# Patient Record
Sex: Male | Born: 1945 | Race: White | Hispanic: No | Marital: Married | Smoking: Never smoker
Health system: Southern US, Community
[De-identification: ages and names within clinical notes are randomized; demographics above are authoritative.]

## PROBLEM LIST (undated history)

## (undated) DIAGNOSIS — A159 Respiratory tuberculosis unspecified: Secondary | ICD-10-CM

## (undated) DIAGNOSIS — Z87442 Personal history of urinary calculi: Secondary | ICD-10-CM

## (undated) DIAGNOSIS — K760 Fatty (change of) liver, not elsewhere classified: Secondary | ICD-10-CM

## (undated) DIAGNOSIS — G4730 Sleep apnea, unspecified: Secondary | ICD-10-CM

## (undated) DIAGNOSIS — C801 Malignant (primary) neoplasm, unspecified: Secondary | ICD-10-CM

## (undated) DIAGNOSIS — E249 Cushing's syndrome, unspecified: Secondary | ICD-10-CM

## (undated) DIAGNOSIS — E119 Type 2 diabetes mellitus without complications: Secondary | ICD-10-CM

## (undated) DIAGNOSIS — R011 Cardiac murmur, unspecified: Secondary | ICD-10-CM

## (undated) DIAGNOSIS — K219 Gastro-esophageal reflux disease without esophagitis: Secondary | ICD-10-CM

## (undated) DIAGNOSIS — M1990 Unspecified osteoarthritis, unspecified site: Secondary | ICD-10-CM

## (undated) DIAGNOSIS — I10 Essential (primary) hypertension: Secondary | ICD-10-CM

## (undated) HISTORY — PX: MOHS SURGERY: SUR867

## (undated) HISTORY — PX: HERNIA REPAIR: SHX51

## (undated) HISTORY — PX: EYE SURGERY: SHX253

## (undated) HISTORY — PX: CHOLECYSTECTOMY: SHX55

## (undated) HISTORY — PX: TONSILLECTOMY: SUR1361

## (undated) HISTORY — PX: FRACTURE SURGERY: SHX138

---

## 2011-05-10 DIAGNOSIS — N529 Male erectile dysfunction, unspecified: Secondary | ICD-10-CM | POA: Insufficient documentation

## 2012-05-31 HISTORY — PX: ADRENALECTOMY: SHX876

## 2013-03-28 DIAGNOSIS — J40 Bronchitis, not specified as acute or chronic: Secondary | ICD-10-CM | POA: Diagnosis not present

## 2013-03-28 DIAGNOSIS — R0602 Shortness of breath: Secondary | ICD-10-CM | POA: Diagnosis not present

## 2013-03-28 DIAGNOSIS — H109 Unspecified conjunctivitis: Secondary | ICD-10-CM | POA: Diagnosis not present

## 2013-04-04 DIAGNOSIS — G4733 Obstructive sleep apnea (adult) (pediatric): Secondary | ICD-10-CM | POA: Diagnosis not present

## 2013-04-04 DIAGNOSIS — J40 Bronchitis, not specified as acute or chronic: Secondary | ICD-10-CM | POA: Diagnosis not present

## 2013-04-04 DIAGNOSIS — Z23 Encounter for immunization: Secondary | ICD-10-CM | POA: Diagnosis not present

## 2013-04-05 DIAGNOSIS — D3500 Benign neoplasm of unspecified adrenal gland: Secondary | ICD-10-CM | POA: Diagnosis not present

## 2013-04-05 DIAGNOSIS — Q441 Other congenital malformations of gallbladder: Secondary | ICD-10-CM | POA: Diagnosis not present

## 2013-04-05 DIAGNOSIS — E279 Disorder of adrenal gland, unspecified: Secondary | ICD-10-CM | POA: Diagnosis not present

## 2013-04-05 DIAGNOSIS — K869 Disease of pancreas, unspecified: Secondary | ICD-10-CM | POA: Diagnosis not present

## 2013-04-05 DIAGNOSIS — N200 Calculus of kidney: Secondary | ICD-10-CM | POA: Diagnosis not present

## 2013-04-20 DIAGNOSIS — I10 Essential (primary) hypertension: Secondary | ICD-10-CM | POA: Diagnosis not present

## 2013-04-20 DIAGNOSIS — K869 Disease of pancreas, unspecified: Secondary | ICD-10-CM | POA: Diagnosis not present

## 2013-04-20 DIAGNOSIS — D499 Neoplasm of unspecified behavior of unspecified site: Secondary | ICD-10-CM | POA: Diagnosis not present

## 2013-04-20 DIAGNOSIS — D379 Neoplasm of uncertain behavior of digestive organ, unspecified: Secondary | ICD-10-CM | POA: Diagnosis not present

## 2013-05-09 DIAGNOSIS — I10 Essential (primary) hypertension: Secondary | ICD-10-CM | POA: Diagnosis not present

## 2013-05-09 DIAGNOSIS — E279 Disorder of adrenal gland, unspecified: Secondary | ICD-10-CM | POA: Diagnosis not present

## 2013-05-16 DIAGNOSIS — E279 Disorder of adrenal gland, unspecified: Secondary | ICD-10-CM | POA: Diagnosis not present

## 2013-05-16 DIAGNOSIS — E249 Cushing's syndrome, unspecified: Secondary | ICD-10-CM | POA: Diagnosis not present

## 2013-05-16 DIAGNOSIS — K862 Cyst of pancreas: Secondary | ICD-10-CM | POA: Diagnosis not present

## 2013-05-19 DIAGNOSIS — E249 Cushing's syndrome, unspecified: Secondary | ICD-10-CM | POA: Diagnosis not present

## 2013-05-19 DIAGNOSIS — K862 Cyst of pancreas: Secondary | ICD-10-CM | POA: Diagnosis not present

## 2013-05-20 DIAGNOSIS — E249 Cushing's syndrome, unspecified: Secondary | ICD-10-CM | POA: Diagnosis not present

## 2013-05-20 DIAGNOSIS — K862 Cyst of pancreas: Secondary | ICD-10-CM | POA: Diagnosis not present

## 2013-05-21 DIAGNOSIS — E249 Cushing's syndrome, unspecified: Secondary | ICD-10-CM | POA: Diagnosis not present

## 2013-05-21 DIAGNOSIS — K862 Cyst of pancreas: Secondary | ICD-10-CM | POA: Diagnosis not present

## 2013-05-28 DIAGNOSIS — G4733 Obstructive sleep apnea (adult) (pediatric): Secondary | ICD-10-CM | POA: Diagnosis not present

## 2013-06-14 DIAGNOSIS — D3500 Benign neoplasm of unspecified adrenal gland: Secondary | ICD-10-CM | POA: Diagnosis not present

## 2013-06-14 DIAGNOSIS — E785 Hyperlipidemia, unspecified: Secondary | ICD-10-CM | POA: Diagnosis not present

## 2013-06-14 DIAGNOSIS — N200 Calculus of kidney: Secondary | ICD-10-CM | POA: Diagnosis not present

## 2013-06-14 DIAGNOSIS — D499 Neoplasm of unspecified behavior of unspecified site: Secondary | ICD-10-CM | POA: Diagnosis not present

## 2013-06-14 DIAGNOSIS — K862 Cyst of pancreas: Secondary | ICD-10-CM | POA: Diagnosis not present

## 2013-06-14 DIAGNOSIS — D379 Neoplasm of uncertain behavior of digestive organ, unspecified: Secondary | ICD-10-CM | POA: Diagnosis not present

## 2013-06-14 DIAGNOSIS — K869 Disease of pancreas, unspecified: Secondary | ICD-10-CM | POA: Diagnosis not present

## 2013-06-14 DIAGNOSIS — R978 Other abnormal tumor markers: Secondary | ICD-10-CM | POA: Diagnosis not present

## 2013-06-14 DIAGNOSIS — K863 Pseudocyst of pancreas: Secondary | ICD-10-CM | POA: Diagnosis not present

## 2013-06-14 DIAGNOSIS — E279 Disorder of adrenal gland, unspecified: Secondary | ICD-10-CM | POA: Diagnosis not present

## 2013-06-14 DIAGNOSIS — K8689 Other specified diseases of pancreas: Secondary | ICD-10-CM | POA: Diagnosis not present

## 2013-06-14 DIAGNOSIS — K469 Unspecified abdominal hernia without obstruction or gangrene: Secondary | ICD-10-CM | POA: Diagnosis not present

## 2013-06-14 DIAGNOSIS — I517 Cardiomegaly: Secondary | ICD-10-CM | POA: Diagnosis not present

## 2013-06-25 DIAGNOSIS — D3500 Benign neoplasm of unspecified adrenal gland: Secondary | ICD-10-CM | POA: Diagnosis not present

## 2013-06-27 DIAGNOSIS — H25099 Other age-related incipient cataract, unspecified eye: Secondary | ICD-10-CM | POA: Diagnosis not present

## 2013-06-27 DIAGNOSIS — I10 Essential (primary) hypertension: Secondary | ICD-10-CM | POA: Diagnosis not present

## 2013-06-27 DIAGNOSIS — E119 Type 2 diabetes mellitus without complications: Secondary | ICD-10-CM | POA: Diagnosis not present

## 2013-06-27 DIAGNOSIS — H5210 Myopia, unspecified eye: Secondary | ICD-10-CM | POA: Diagnosis not present

## 2013-07-02 DIAGNOSIS — Z79899 Other long term (current) drug therapy: Secondary | ICD-10-CM | POA: Diagnosis not present

## 2013-07-02 DIAGNOSIS — K8020 Calculus of gallbladder without cholecystitis without obstruction: Secondary | ICD-10-CM | POA: Diagnosis not present

## 2013-07-02 DIAGNOSIS — K859 Acute pancreatitis without necrosis or infection, unspecified: Secondary | ICD-10-CM | POA: Diagnosis not present

## 2013-07-02 DIAGNOSIS — R1084 Generalized abdominal pain: Secondary | ICD-10-CM | POA: Diagnosis not present

## 2013-07-02 DIAGNOSIS — N200 Calculus of kidney: Secondary | ICD-10-CM | POA: Diagnosis not present

## 2013-07-02 DIAGNOSIS — D3500 Benign neoplasm of unspecified adrenal gland: Secondary | ICD-10-CM | POA: Diagnosis not present

## 2013-07-02 DIAGNOSIS — I10 Essential (primary) hypertension: Secondary | ICD-10-CM | POA: Diagnosis not present

## 2013-07-02 DIAGNOSIS — R1032 Left lower quadrant pain: Secondary | ICD-10-CM | POA: Diagnosis not present

## 2013-07-02 DIAGNOSIS — K8689 Other specified diseases of pancreas: Secondary | ICD-10-CM | POA: Diagnosis not present

## 2013-07-02 DIAGNOSIS — E119 Type 2 diabetes mellitus without complications: Secondary | ICD-10-CM | POA: Diagnosis not present

## 2013-07-03 DIAGNOSIS — E785 Hyperlipidemia, unspecified: Secondary | ICD-10-CM | POA: Diagnosis present

## 2013-07-03 DIAGNOSIS — D3500 Benign neoplasm of unspecified adrenal gland: Secondary | ICD-10-CM | POA: Diagnosis not present

## 2013-07-03 DIAGNOSIS — R109 Unspecified abdominal pain: Secondary | ICD-10-CM | POA: Diagnosis not present

## 2013-07-03 DIAGNOSIS — I10 Essential (primary) hypertension: Secondary | ICD-10-CM | POA: Diagnosis present

## 2013-07-03 DIAGNOSIS — R1032 Left lower quadrant pain: Secondary | ICD-10-CM | POA: Diagnosis not present

## 2013-07-03 DIAGNOSIS — K5730 Diverticulosis of large intestine without perforation or abscess without bleeding: Secondary | ICD-10-CM | POA: Diagnosis present

## 2013-07-03 DIAGNOSIS — K5190 Ulcerative colitis, unspecified, without complications: Secondary | ICD-10-CM | POA: Diagnosis present

## 2013-07-03 DIAGNOSIS — I498 Other specified cardiac arrhythmias: Secondary | ICD-10-CM | POA: Diagnosis not present

## 2013-07-03 DIAGNOSIS — Z880 Allergy status to penicillin: Secondary | ICD-10-CM | POA: Diagnosis not present

## 2013-07-03 DIAGNOSIS — Z79899 Other long term (current) drug therapy: Secondary | ICD-10-CM | POA: Diagnosis not present

## 2013-07-03 DIAGNOSIS — N200 Calculus of kidney: Secondary | ICD-10-CM | POA: Diagnosis not present

## 2013-07-03 DIAGNOSIS — K8020 Calculus of gallbladder without cholecystitis without obstruction: Secondary | ICD-10-CM | POA: Diagnosis not present

## 2013-07-03 DIAGNOSIS — N400 Enlarged prostate without lower urinary tract symptoms: Secondary | ICD-10-CM | POA: Diagnosis present

## 2013-07-03 DIAGNOSIS — K8689 Other specified diseases of pancreas: Secondary | ICD-10-CM | POA: Diagnosis present

## 2013-07-03 DIAGNOSIS — K859 Acute pancreatitis without necrosis or infection, unspecified: Secondary | ICD-10-CM | POA: Diagnosis not present

## 2013-07-03 DIAGNOSIS — E249 Cushing's syndrome, unspecified: Secondary | ICD-10-CM | POA: Diagnosis present

## 2013-07-03 DIAGNOSIS — E119 Type 2 diabetes mellitus without complications: Secondary | ICD-10-CM | POA: Diagnosis present

## 2013-07-19 DIAGNOSIS — E249 Cushing's syndrome, unspecified: Secondary | ICD-10-CM | POA: Diagnosis not present

## 2013-07-19 DIAGNOSIS — D3500 Benign neoplasm of unspecified adrenal gland: Secondary | ICD-10-CM | POA: Diagnosis not present

## 2013-08-01 DIAGNOSIS — K219 Gastro-esophageal reflux disease without esophagitis: Secondary | ICD-10-CM | POA: Diagnosis not present

## 2013-08-01 DIAGNOSIS — E279 Disorder of adrenal gland, unspecified: Secondary | ICD-10-CM | POA: Diagnosis not present

## 2013-08-01 DIAGNOSIS — K859 Acute pancreatitis without necrosis or infection, unspecified: Secondary | ICD-10-CM | POA: Diagnosis not present

## 2013-08-01 DIAGNOSIS — Z006 Encounter for examination for normal comparison and control in clinical research program: Secondary | ICD-10-CM | POA: Diagnosis not present

## 2013-08-01 DIAGNOSIS — G4733 Obstructive sleep apnea (adult) (pediatric): Secondary | ICD-10-CM | POA: Diagnosis not present

## 2013-08-01 DIAGNOSIS — I10 Essential (primary) hypertension: Secondary | ICD-10-CM | POA: Diagnosis not present

## 2013-08-01 DIAGNOSIS — E119 Type 2 diabetes mellitus without complications: Secondary | ICD-10-CM | POA: Diagnosis not present

## 2013-08-01 DIAGNOSIS — H269 Unspecified cataract: Secondary | ICD-10-CM | POA: Diagnosis not present

## 2013-08-01 DIAGNOSIS — E249 Cushing's syndrome, unspecified: Secondary | ICD-10-CM | POA: Diagnosis not present

## 2013-08-02 DIAGNOSIS — K8689 Other specified diseases of pancreas: Secondary | ICD-10-CM | POA: Diagnosis not present

## 2013-08-02 DIAGNOSIS — G4733 Obstructive sleep apnea (adult) (pediatric): Secondary | ICD-10-CM | POA: Insufficient documentation

## 2013-08-02 DIAGNOSIS — Z419 Encounter for procedure for purposes other than remedying health state, unspecified: Secondary | ICD-10-CM | POA: Insufficient documentation

## 2013-08-02 DIAGNOSIS — K862 Cyst of pancreas: Secondary | ICD-10-CM | POA: Diagnosis not present

## 2013-08-02 DIAGNOSIS — Z01818 Encounter for other preprocedural examination: Secondary | ICD-10-CM | POA: Diagnosis not present

## 2013-08-02 DIAGNOSIS — K869 Disease of pancreas, unspecified: Secondary | ICD-10-CM | POA: Diagnosis not present

## 2013-08-06 DIAGNOSIS — K8689 Other specified diseases of pancreas: Secondary | ICD-10-CM | POA: Diagnosis not present

## 2013-08-06 DIAGNOSIS — K863 Pseudocyst of pancreas: Secondary | ICD-10-CM | POA: Diagnosis not present

## 2013-08-06 DIAGNOSIS — D3500 Benign neoplasm of unspecified adrenal gland: Secondary | ICD-10-CM | POA: Diagnosis not present

## 2013-08-06 DIAGNOSIS — E785 Hyperlipidemia, unspecified: Secondary | ICD-10-CM | POA: Diagnosis not present

## 2013-08-06 DIAGNOSIS — K859 Acute pancreatitis without necrosis or infection, unspecified: Secondary | ICD-10-CM | POA: Diagnosis not present

## 2013-08-06 DIAGNOSIS — Z86010 Personal history of colonic polyps: Secondary | ICD-10-CM | POA: Diagnosis not present

## 2013-08-06 DIAGNOSIS — I10 Essential (primary) hypertension: Secondary | ICD-10-CM | POA: Diagnosis not present

## 2013-08-06 DIAGNOSIS — K862 Cyst of pancreas: Secondary | ICD-10-CM | POA: Diagnosis not present

## 2013-08-06 DIAGNOSIS — K861 Other chronic pancreatitis: Secondary | ICD-10-CM | POA: Diagnosis not present

## 2013-08-06 DIAGNOSIS — G4733 Obstructive sleep apnea (adult) (pediatric): Secondary | ICD-10-CM | POA: Diagnosis not present

## 2013-08-06 DIAGNOSIS — R933 Abnormal findings on diagnostic imaging of other parts of digestive tract: Secondary | ICD-10-CM | POA: Diagnosis not present

## 2013-08-06 DIAGNOSIS — Z880 Allergy status to penicillin: Secondary | ICD-10-CM | POA: Diagnosis not present

## 2013-08-13 DIAGNOSIS — M79609 Pain in unspecified limb: Secondary | ICD-10-CM | POA: Diagnosis not present

## 2013-08-13 DIAGNOSIS — M549 Dorsalgia, unspecified: Secondary | ICD-10-CM | POA: Diagnosis not present

## 2013-08-17 DIAGNOSIS — H40019 Open angle with borderline findings, low risk, unspecified eye: Secondary | ICD-10-CM | POA: Diagnosis not present

## 2013-08-17 DIAGNOSIS — H18599 Other hereditary corneal dystrophies, unspecified eye: Secondary | ICD-10-CM | POA: Diagnosis not present

## 2013-08-17 DIAGNOSIS — H2510 Age-related nuclear cataract, unspecified eye: Secondary | ICD-10-CM | POA: Diagnosis not present

## 2013-08-30 DIAGNOSIS — E249 Cushing's syndrome, unspecified: Secondary | ICD-10-CM | POA: Diagnosis not present

## 2013-08-30 DIAGNOSIS — K859 Acute pancreatitis without necrosis or infection, unspecified: Secondary | ICD-10-CM | POA: Diagnosis not present

## 2013-08-30 DIAGNOSIS — K869 Disease of pancreas, unspecified: Secondary | ICD-10-CM | POA: Diagnosis not present

## 2013-08-30 DIAGNOSIS — D3500 Benign neoplasm of unspecified adrenal gland: Secondary | ICD-10-CM | POA: Diagnosis not present

## 2013-09-06 DIAGNOSIS — E349 Endocrine disorder, unspecified: Secondary | ICD-10-CM | POA: Diagnosis not present

## 2013-09-06 DIAGNOSIS — I158 Other secondary hypertension: Secondary | ICD-10-CM | POA: Diagnosis not present

## 2013-09-06 DIAGNOSIS — R809 Proteinuria, unspecified: Secondary | ICD-10-CM | POA: Diagnosis not present

## 2013-09-06 DIAGNOSIS — E876 Hypokalemia: Secondary | ICD-10-CM | POA: Diagnosis not present

## 2013-09-28 DIAGNOSIS — E249 Cushing's syndrome, unspecified: Secondary | ICD-10-CM | POA: Diagnosis not present

## 2013-10-08 DIAGNOSIS — N62 Hypertrophy of breast: Secondary | ICD-10-CM | POA: Diagnosis not present

## 2013-10-08 DIAGNOSIS — I10 Essential (primary) hypertension: Secondary | ICD-10-CM | POA: Diagnosis not present

## 2013-10-08 DIAGNOSIS — N6009 Solitary cyst of unspecified breast: Secondary | ICD-10-CM | POA: Diagnosis not present

## 2013-10-11 DIAGNOSIS — E279 Disorder of adrenal gland, unspecified: Secondary | ICD-10-CM | POA: Diagnosis not present

## 2013-10-11 DIAGNOSIS — E349 Endocrine disorder, unspecified: Secondary | ICD-10-CM | POA: Diagnosis not present

## 2013-10-11 DIAGNOSIS — I158 Other secondary hypertension: Secondary | ICD-10-CM | POA: Diagnosis not present

## 2013-10-31 DIAGNOSIS — K819 Cholecystitis, unspecified: Secondary | ICD-10-CM | POA: Diagnosis not present

## 2013-10-31 DIAGNOSIS — G4733 Obstructive sleep apnea (adult) (pediatric): Secondary | ICD-10-CM | POA: Diagnosis present

## 2013-10-31 DIAGNOSIS — D3500 Benign neoplasm of unspecified adrenal gland: Secondary | ICD-10-CM | POA: Diagnosis not present

## 2013-10-31 DIAGNOSIS — N529 Male erectile dysfunction, unspecified: Secondary | ICD-10-CM | POA: Diagnosis present

## 2013-10-31 DIAGNOSIS — K861 Other chronic pancreatitis: Secondary | ICD-10-CM | POA: Diagnosis present

## 2013-10-31 DIAGNOSIS — Z880 Allergy status to penicillin: Secondary | ICD-10-CM | POA: Diagnosis not present

## 2013-10-31 DIAGNOSIS — E119 Type 2 diabetes mellitus without complications: Secondary | ICD-10-CM | POA: Diagnosis not present

## 2013-10-31 DIAGNOSIS — R338 Other retention of urine: Secondary | ICD-10-CM | POA: Diagnosis not present

## 2013-10-31 DIAGNOSIS — K5730 Diverticulosis of large intestine without perforation or abscess without bleeding: Secondary | ICD-10-CM | POA: Diagnosis present

## 2013-10-31 DIAGNOSIS — I10 Essential (primary) hypertension: Secondary | ICD-10-CM | POA: Diagnosis present

## 2013-10-31 DIAGNOSIS — K5190 Ulcerative colitis, unspecified, without complications: Secondary | ICD-10-CM | POA: Diagnosis not present

## 2013-10-31 DIAGNOSIS — K8689 Other specified diseases of pancreas: Secondary | ICD-10-CM | POA: Diagnosis present

## 2013-10-31 DIAGNOSIS — K8020 Calculus of gallbladder without cholecystitis without obstruction: Secondary | ICD-10-CM | POA: Diagnosis not present

## 2013-10-31 DIAGNOSIS — E785 Hyperlipidemia, unspecified: Secondary | ICD-10-CM | POA: Diagnosis present

## 2013-10-31 DIAGNOSIS — K859 Acute pancreatitis without necrosis or infection, unspecified: Secondary | ICD-10-CM | POA: Diagnosis not present

## 2013-10-31 DIAGNOSIS — K7689 Other specified diseases of liver: Secondary | ICD-10-CM | POA: Diagnosis present

## 2013-10-31 DIAGNOSIS — E2749 Other adrenocortical insufficiency: Secondary | ICD-10-CM | POA: Diagnosis not present

## 2013-10-31 DIAGNOSIS — K863 Pseudocyst of pancreas: Secondary | ICD-10-CM | POA: Diagnosis not present

## 2013-10-31 DIAGNOSIS — E249 Cushing's syndrome, unspecified: Secondary | ICD-10-CM | POA: Insufficient documentation

## 2013-10-31 DIAGNOSIS — K862 Cyst of pancreas: Secondary | ICD-10-CM | POA: Diagnosis present

## 2013-11-04 DIAGNOSIS — R109 Unspecified abdominal pain: Secondary | ICD-10-CM | POA: Diagnosis not present

## 2013-11-04 DIAGNOSIS — R110 Nausea: Secondary | ICD-10-CM | POA: Diagnosis not present

## 2013-11-04 DIAGNOSIS — R1084 Generalized abdominal pain: Secondary | ICD-10-CM | POA: Diagnosis not present

## 2013-11-04 DIAGNOSIS — K7689 Other specified diseases of liver: Secondary | ICD-10-CM | POA: Diagnosis not present

## 2013-11-07 DIAGNOSIS — I10 Essential (primary) hypertension: Secondary | ICD-10-CM | POA: Diagnosis not present

## 2013-11-12 DIAGNOSIS — K859 Acute pancreatitis without necrosis or infection, unspecified: Secondary | ICD-10-CM | POA: Diagnosis not present

## 2013-11-12 DIAGNOSIS — D379 Neoplasm of uncertain behavior of digestive organ, unspecified: Secondary | ICD-10-CM | POA: Diagnosis not present

## 2013-11-12 DIAGNOSIS — E249 Cushing's syndrome, unspecified: Secondary | ICD-10-CM | POA: Diagnosis not present

## 2013-11-22 DIAGNOSIS — E279 Disorder of adrenal gland, unspecified: Secondary | ICD-10-CM | POA: Diagnosis not present

## 2014-02-22 DIAGNOSIS — Z23 Encounter for immunization: Secondary | ICD-10-CM | POA: Diagnosis not present

## 2014-03-04 DIAGNOSIS — K5730 Diverticulosis of large intestine without perforation or abscess without bleeding: Secondary | ICD-10-CM | POA: Diagnosis not present

## 2014-03-04 DIAGNOSIS — Z86010 Personal history of colonic polyps: Secondary | ICD-10-CM | POA: Diagnosis not present

## 2014-03-04 DIAGNOSIS — K5010 Crohn's disease of large intestine without complications: Secondary | ICD-10-CM | POA: Diagnosis not present

## 2014-03-04 DIAGNOSIS — K5090 Crohn's disease, unspecified, without complications: Secondary | ICD-10-CM | POA: Diagnosis not present

## 2014-04-20 DIAGNOSIS — R079 Chest pain, unspecified: Secondary | ICD-10-CM | POA: Diagnosis not present

## 2014-04-20 DIAGNOSIS — I10 Essential (primary) hypertension: Secondary | ICD-10-CM | POA: Diagnosis not present

## 2014-04-20 DIAGNOSIS — R0781 Pleurodynia: Secondary | ICD-10-CM | POA: Diagnosis not present

## 2014-04-20 DIAGNOSIS — Z880 Allergy status to penicillin: Secondary | ICD-10-CM | POA: Diagnosis not present

## 2014-04-20 DIAGNOSIS — K859 Acute pancreatitis, unspecified: Secondary | ICD-10-CM | POA: Diagnosis not present

## 2014-04-20 DIAGNOSIS — K5190 Ulcerative colitis, unspecified, without complications: Secondary | ICD-10-CM | POA: Diagnosis not present

## 2014-04-24 DIAGNOSIS — E119 Type 2 diabetes mellitus without complications: Secondary | ICD-10-CM | POA: Diagnosis not present

## 2014-04-24 DIAGNOSIS — E785 Hyperlipidemia, unspecified: Secondary | ICD-10-CM | POA: Diagnosis not present

## 2014-04-24 DIAGNOSIS — I10 Essential (primary) hypertension: Secondary | ICD-10-CM | POA: Diagnosis not present

## 2014-04-29 DIAGNOSIS — H18453 Nodular corneal degeneration, bilateral: Secondary | ICD-10-CM | POA: Diagnosis not present

## 2014-04-29 DIAGNOSIS — H25812 Combined forms of age-related cataract, left eye: Secondary | ICD-10-CM | POA: Diagnosis not present

## 2014-04-29 DIAGNOSIS — H18452 Nodular corneal degeneration, left eye: Secondary | ICD-10-CM | POA: Diagnosis not present

## 2014-04-29 DIAGNOSIS — H2512 Age-related nuclear cataract, left eye: Secondary | ICD-10-CM | POA: Diagnosis not present

## 2014-04-29 DIAGNOSIS — H52212 Irregular astigmatism, left eye: Secondary | ICD-10-CM | POA: Diagnosis not present

## 2014-04-29 DIAGNOSIS — H5213 Myopia, bilateral: Secondary | ICD-10-CM | POA: Diagnosis not present

## 2014-05-13 DIAGNOSIS — K868 Other specified diseases of pancreas: Secondary | ICD-10-CM | POA: Diagnosis not present

## 2014-05-13 DIAGNOSIS — N200 Calculus of kidney: Secondary | ICD-10-CM | POA: Diagnosis not present

## 2014-05-13 DIAGNOSIS — R978 Other abnormal tumor markers: Secondary | ICD-10-CM | POA: Diagnosis not present

## 2014-05-13 DIAGNOSIS — E249 Cushing's syndrome, unspecified: Secondary | ICD-10-CM | POA: Diagnosis not present

## 2014-05-13 DIAGNOSIS — E785 Hyperlipidemia, unspecified: Secondary | ICD-10-CM | POA: Diagnosis not present

## 2014-05-13 DIAGNOSIS — K850 Idiopathic acute pancreatitis: Secondary | ICD-10-CM | POA: Diagnosis not present

## 2014-05-13 DIAGNOSIS — Z9889 Other specified postprocedural states: Secondary | ICD-10-CM | POA: Diagnosis not present

## 2014-05-13 DIAGNOSIS — K859 Acute pancreatitis, unspecified: Secondary | ICD-10-CM | POA: Diagnosis not present

## 2014-05-13 DIAGNOSIS — K869 Disease of pancreas, unspecified: Secondary | ICD-10-CM | POA: Diagnosis not present

## 2014-05-13 DIAGNOSIS — K862 Cyst of pancreas: Secondary | ICD-10-CM | POA: Diagnosis not present

## 2014-05-13 DIAGNOSIS — Z86018 Personal history of other benign neoplasm: Secondary | ICD-10-CM | POA: Diagnosis not present

## 2014-05-13 DIAGNOSIS — D379 Neoplasm of uncertain behavior of digestive organ, unspecified: Secondary | ICD-10-CM | POA: Diagnosis not present

## 2014-05-13 DIAGNOSIS — Z79899 Other long term (current) drug therapy: Secondary | ICD-10-CM | POA: Diagnosis not present

## 2014-05-13 DIAGNOSIS — I10 Essential (primary) hypertension: Secondary | ICD-10-CM | POA: Diagnosis not present

## 2014-05-28 DIAGNOSIS — E119 Type 2 diabetes mellitus without complications: Secondary | ICD-10-CM | POA: Diagnosis not present

## 2014-06-04 DIAGNOSIS — E119 Type 2 diabetes mellitus without complications: Secondary | ICD-10-CM | POA: Diagnosis not present

## 2014-06-18 DIAGNOSIS — K861 Other chronic pancreatitis: Secondary | ICD-10-CM | POA: Diagnosis not present

## 2014-06-18 DIAGNOSIS — K5010 Crohn's disease of large intestine without complications: Secondary | ICD-10-CM | POA: Diagnosis not present

## 2014-06-23 DIAGNOSIS — W01198A Fall on same level from slipping, tripping and stumbling with subsequent striking against other object, initial encounter: Secondary | ICD-10-CM | POA: Diagnosis not present

## 2014-06-23 DIAGNOSIS — L8992 Pressure ulcer of unspecified site, stage 2: Secondary | ICD-10-CM | POA: Diagnosis not present

## 2014-06-23 DIAGNOSIS — S161XXA Strain of muscle, fascia and tendon at neck level, initial encounter: Secondary | ICD-10-CM | POA: Diagnosis not present

## 2014-06-23 DIAGNOSIS — S0990XA Unspecified injury of head, initial encounter: Secondary | ICD-10-CM | POA: Diagnosis not present

## 2014-06-23 DIAGNOSIS — E119 Type 2 diabetes mellitus without complications: Secondary | ICD-10-CM | POA: Diagnosis not present

## 2014-06-23 DIAGNOSIS — S060X0A Concussion without loss of consciousness, initial encounter: Secondary | ICD-10-CM | POA: Diagnosis not present

## 2014-06-23 DIAGNOSIS — S199XXA Unspecified injury of neck, initial encounter: Secondary | ICD-10-CM | POA: Diagnosis not present

## 2014-06-23 DIAGNOSIS — S134XXA Sprain of ligaments of cervical spine, initial encounter: Secondary | ICD-10-CM | POA: Diagnosis not present

## 2014-06-23 DIAGNOSIS — I10 Essential (primary) hypertension: Secondary | ICD-10-CM | POA: Diagnosis not present

## 2014-06-23 DIAGNOSIS — R040 Epistaxis: Secondary | ICD-10-CM | POA: Diagnosis not present

## 2014-06-23 DIAGNOSIS — R51 Headache: Secondary | ICD-10-CM | POA: Diagnosis not present

## 2014-07-24 DIAGNOSIS — I10 Essential (primary) hypertension: Secondary | ICD-10-CM | POA: Diagnosis not present

## 2014-08-12 DIAGNOSIS — H2512 Age-related nuclear cataract, left eye: Secondary | ICD-10-CM | POA: Diagnosis not present

## 2014-08-14 DIAGNOSIS — H18232 Secondary corneal edema, left eye: Secondary | ICD-10-CM | POA: Diagnosis not present

## 2014-08-14 DIAGNOSIS — H25812 Combined forms of age-related cataract, left eye: Secondary | ICD-10-CM | POA: Diagnosis not present

## 2014-08-14 DIAGNOSIS — I10 Essential (primary) hypertension: Secondary | ICD-10-CM | POA: Diagnosis not present

## 2014-08-14 DIAGNOSIS — H2512 Age-related nuclear cataract, left eye: Secondary | ICD-10-CM | POA: Diagnosis not present

## 2014-08-14 DIAGNOSIS — Z961 Presence of intraocular lens: Secondary | ICD-10-CM | POA: Diagnosis not present

## 2014-08-22 DIAGNOSIS — Z961 Presence of intraocular lens: Secondary | ICD-10-CM | POA: Diagnosis not present

## 2014-08-22 DIAGNOSIS — I10 Essential (primary) hypertension: Secondary | ICD-10-CM | POA: Diagnosis not present

## 2014-08-22 DIAGNOSIS — Z9841 Cataract extraction status, right eye: Secondary | ICD-10-CM | POA: Diagnosis not present

## 2014-08-22 DIAGNOSIS — H52223 Regular astigmatism, bilateral: Secondary | ICD-10-CM | POA: Diagnosis not present

## 2014-08-22 DIAGNOSIS — H2511 Age-related nuclear cataract, right eye: Secondary | ICD-10-CM | POA: Diagnosis not present

## 2014-08-22 DIAGNOSIS — Z9842 Cataract extraction status, left eye: Secondary | ICD-10-CM | POA: Diagnosis not present

## 2014-08-22 DIAGNOSIS — H5203 Hypermetropia, bilateral: Secondary | ICD-10-CM | POA: Diagnosis not present

## 2014-08-22 DIAGNOSIS — H25811 Combined forms of age-related cataract, right eye: Secondary | ICD-10-CM | POA: Diagnosis not present

## 2014-09-22 DIAGNOSIS — J309 Allergic rhinitis, unspecified: Secondary | ICD-10-CM | POA: Diagnosis not present

## 2014-09-22 DIAGNOSIS — H109 Unspecified conjunctivitis: Secondary | ICD-10-CM | POA: Diagnosis not present

## 2014-11-14 DIAGNOSIS — D379 Neoplasm of uncertain behavior of digestive organ, unspecified: Secondary | ICD-10-CM | POA: Diagnosis not present

## 2014-11-14 DIAGNOSIS — Z9089 Acquired absence of other organs: Secondary | ICD-10-CM | POA: Diagnosis not present

## 2014-11-14 DIAGNOSIS — Z9049 Acquired absence of other specified parts of digestive tract: Secondary | ICD-10-CM | POA: Diagnosis not present

## 2014-11-14 DIAGNOSIS — K862 Cyst of pancreas: Secondary | ICD-10-CM | POA: Diagnosis not present

## 2014-11-14 DIAGNOSIS — K868 Other specified diseases of pancreas: Secondary | ICD-10-CM | POA: Diagnosis not present

## 2014-11-14 DIAGNOSIS — K858 Other acute pancreatitis: Secondary | ICD-10-CM | POA: Diagnosis not present

## 2014-12-23 DIAGNOSIS — E119 Type 2 diabetes mellitus without complications: Secondary | ICD-10-CM | POA: Diagnosis not present

## 2014-12-30 DIAGNOSIS — E119 Type 2 diabetes mellitus without complications: Secondary | ICD-10-CM | POA: Diagnosis not present

## 2014-12-30 DIAGNOSIS — Z1389 Encounter for screening for other disorder: Secondary | ICD-10-CM | POA: Diagnosis not present

## 2014-12-30 DIAGNOSIS — Z760 Encounter for issue of repeat prescription: Secondary | ICD-10-CM | POA: Diagnosis not present

## 2015-01-07 DIAGNOSIS — K5010 Crohn's disease of large intestine without complications: Secondary | ICD-10-CM | POA: Diagnosis not present

## 2015-01-07 DIAGNOSIS — K861 Other chronic pancreatitis: Secondary | ICD-10-CM | POA: Diagnosis not present

## 2015-01-07 DIAGNOSIS — R159 Full incontinence of feces: Secondary | ICD-10-CM | POA: Diagnosis not present

## 2015-01-13 DIAGNOSIS — K5010 Crohn's disease of large intestine without complications: Secondary | ICD-10-CM | POA: Diagnosis not present

## 2015-01-22 DIAGNOSIS — I10 Essential (primary) hypertension: Secondary | ICD-10-CM | POA: Diagnosis not present

## 2015-01-31 DIAGNOSIS — E785 Hyperlipidemia, unspecified: Secondary | ICD-10-CM | POA: Diagnosis not present

## 2015-01-31 DIAGNOSIS — R9431 Abnormal electrocardiogram [ECG] [EKG]: Secondary | ICD-10-CM | POA: Diagnosis not present

## 2015-01-31 DIAGNOSIS — E119 Type 2 diabetes mellitus without complications: Secondary | ICD-10-CM | POA: Diagnosis not present

## 2015-01-31 DIAGNOSIS — R0602 Shortness of breath: Secondary | ICD-10-CM | POA: Diagnosis not present

## 2015-01-31 DIAGNOSIS — R001 Bradycardia, unspecified: Secondary | ICD-10-CM | POA: Diagnosis not present

## 2015-01-31 DIAGNOSIS — R079 Chest pain, unspecified: Secondary | ICD-10-CM | POA: Diagnosis not present

## 2015-01-31 DIAGNOSIS — R5383 Other fatigue: Secondary | ICD-10-CM | POA: Diagnosis not present

## 2015-01-31 DIAGNOSIS — I4510 Unspecified right bundle-branch block: Secondary | ICD-10-CM | POA: Diagnosis not present

## 2015-01-31 DIAGNOSIS — I158 Other secondary hypertension: Secondary | ICD-10-CM | POA: Diagnosis not present

## 2015-02-01 DIAGNOSIS — R9431 Abnormal electrocardiogram [ECG] [EKG]: Secondary | ICD-10-CM | POA: Diagnosis not present

## 2015-02-01 DIAGNOSIS — R001 Bradycardia, unspecified: Secondary | ICD-10-CM | POA: Diagnosis not present

## 2015-02-01 DIAGNOSIS — R079 Chest pain, unspecified: Secondary | ICD-10-CM | POA: Diagnosis not present

## 2015-02-01 DIAGNOSIS — I4510 Unspecified right bundle-branch block: Secondary | ICD-10-CM | POA: Diagnosis not present

## 2015-02-01 DIAGNOSIS — I158 Other secondary hypertension: Secondary | ICD-10-CM | POA: Diagnosis not present

## 2015-02-01 DIAGNOSIS — I491 Atrial premature depolarization: Secondary | ICD-10-CM | POA: Diagnosis not present

## 2015-02-14 DIAGNOSIS — D696 Thrombocytopenia, unspecified: Secondary | ICD-10-CM | POA: Diagnosis not present

## 2015-02-19 DIAGNOSIS — J069 Acute upper respiratory infection, unspecified: Secondary | ICD-10-CM | POA: Diagnosis not present

## 2015-03-03 DIAGNOSIS — Z23 Encounter for immunization: Secondary | ICD-10-CM | POA: Diagnosis not present

## 2015-04-04 DIAGNOSIS — E119 Type 2 diabetes mellitus without complications: Secondary | ICD-10-CM | POA: Diagnosis not present

## 2015-04-11 DIAGNOSIS — I10 Essential (primary) hypertension: Secondary | ICD-10-CM | POA: Diagnosis not present

## 2015-04-11 DIAGNOSIS — Z0000 Encounter for general adult medical examination without abnormal findings: Secondary | ICD-10-CM | POA: Diagnosis not present

## 2015-04-11 DIAGNOSIS — E1121 Type 2 diabetes mellitus with diabetic nephropathy: Secondary | ICD-10-CM | POA: Diagnosis not present

## 2015-04-11 DIAGNOSIS — E249 Cushing's syndrome, unspecified: Secondary | ICD-10-CM | POA: Diagnosis not present

## 2015-05-01 DIAGNOSIS — R748 Abnormal levels of other serum enzymes: Secondary | ICD-10-CM | POA: Diagnosis not present

## 2015-07-24 DIAGNOSIS — L309 Dermatitis, unspecified: Secondary | ICD-10-CM | POA: Diagnosis not present

## 2015-09-16 DIAGNOSIS — R5383 Other fatigue: Secondary | ICD-10-CM | POA: Diagnosis not present

## 2015-09-28 DIAGNOSIS — J209 Acute bronchitis, unspecified: Secondary | ICD-10-CM | POA: Diagnosis not present

## 2015-09-28 DIAGNOSIS — J918 Pleural effusion in other conditions classified elsewhere: Secondary | ICD-10-CM | POA: Diagnosis not present

## 2015-10-15 DIAGNOSIS — R5383 Other fatigue: Secondary | ICD-10-CM | POA: Diagnosis not present

## 2015-10-15 DIAGNOSIS — E1165 Type 2 diabetes mellitus with hyperglycemia: Secondary | ICD-10-CM | POA: Diagnosis not present

## 2015-11-20 DIAGNOSIS — I700 Atherosclerosis of aorta: Secondary | ICD-10-CM | POA: Diagnosis not present

## 2015-11-20 DIAGNOSIS — M954 Acquired deformity of chest and rib: Secondary | ICD-10-CM | POA: Diagnosis not present

## 2015-11-20 DIAGNOSIS — K760 Fatty (change of) liver, not elsewhere classified: Secondary | ICD-10-CM | POA: Diagnosis not present

## 2015-11-20 DIAGNOSIS — I351 Nonrheumatic aortic (valve) insufficiency: Secondary | ICD-10-CM | POA: Diagnosis not present

## 2015-11-20 DIAGNOSIS — I77811 Abdominal aortic ectasia: Secondary | ICD-10-CM | POA: Diagnosis not present

## 2015-11-20 DIAGNOSIS — K8689 Other specified diseases of pancreas: Secondary | ICD-10-CM | POA: Diagnosis not present

## 2015-11-20 DIAGNOSIS — K858 Other acute pancreatitis without necrosis or infection: Secondary | ICD-10-CM | POA: Diagnosis not present

## 2015-11-20 DIAGNOSIS — I708 Atherosclerosis of other arteries: Secondary | ICD-10-CM | POA: Diagnosis not present

## 2015-11-20 DIAGNOSIS — K5790 Diverticulosis of intestine, part unspecified, without perforation or abscess without bleeding: Secondary | ICD-10-CM | POA: Diagnosis not present

## 2015-11-20 DIAGNOSIS — D379 Neoplasm of uncertain behavior of digestive organ, unspecified: Secondary | ICD-10-CM | POA: Diagnosis not present

## 2015-11-20 DIAGNOSIS — N3289 Other specified disorders of bladder: Secondary | ICD-10-CM | POA: Diagnosis not present

## 2015-11-20 DIAGNOSIS — K861 Other chronic pancreatitis: Secondary | ICD-10-CM | POA: Diagnosis not present

## 2015-11-20 DIAGNOSIS — M4328 Fusion of spine, sacral and sacrococcygeal region: Secondary | ICD-10-CM | POA: Diagnosis not present

## 2015-11-20 DIAGNOSIS — K449 Diaphragmatic hernia without obstruction or gangrene: Secondary | ICD-10-CM | POA: Diagnosis not present

## 2015-11-20 DIAGNOSIS — K5710 Diverticulosis of small intestine without perforation or abscess without bleeding: Secondary | ICD-10-CM | POA: Diagnosis not present

## 2015-11-20 DIAGNOSIS — N2889 Other specified disorders of kidney and ureter: Secondary | ICD-10-CM | POA: Diagnosis not present

## 2015-11-20 DIAGNOSIS — R911 Solitary pulmonary nodule: Secondary | ICD-10-CM | POA: Diagnosis not present

## 2015-11-20 DIAGNOSIS — R160 Hepatomegaly, not elsewhere classified: Secondary | ICD-10-CM | POA: Diagnosis not present

## 2015-11-20 DIAGNOSIS — J9811 Atelectasis: Secondary | ICD-10-CM | POA: Diagnosis not present

## 2015-11-20 DIAGNOSIS — Z9049 Acquired absence of other specified parts of digestive tract: Secondary | ICD-10-CM | POA: Diagnosis not present

## 2015-11-20 DIAGNOSIS — R161 Splenomegaly, not elsewhere classified: Secondary | ICD-10-CM | POA: Diagnosis not present

## 2015-11-20 DIAGNOSIS — K862 Cyst of pancreas: Secondary | ICD-10-CM | POA: Diagnosis not present

## 2015-12-23 DIAGNOSIS — Z9841 Cataract extraction status, right eye: Secondary | ICD-10-CM | POA: Diagnosis not present

## 2015-12-23 DIAGNOSIS — H524 Presbyopia: Secondary | ICD-10-CM | POA: Diagnosis not present

## 2015-12-23 DIAGNOSIS — Z9842 Cataract extraction status, left eye: Secondary | ICD-10-CM | POA: Diagnosis not present

## 2015-12-23 DIAGNOSIS — Z7984 Long term (current) use of oral hypoglycemic drugs: Secondary | ICD-10-CM | POA: Diagnosis not present

## 2015-12-23 DIAGNOSIS — E119 Type 2 diabetes mellitus without complications: Secondary | ICD-10-CM | POA: Diagnosis not present

## 2015-12-23 DIAGNOSIS — H5203 Hypermetropia, bilateral: Secondary | ICD-10-CM | POA: Diagnosis not present

## 2015-12-23 DIAGNOSIS — H52223 Regular astigmatism, bilateral: Secondary | ICD-10-CM | POA: Diagnosis not present

## 2015-12-23 DIAGNOSIS — I10 Essential (primary) hypertension: Secondary | ICD-10-CM | POA: Diagnosis not present

## 2015-12-23 DIAGNOSIS — Z961 Presence of intraocular lens: Secondary | ICD-10-CM | POA: Diagnosis not present

## 2016-01-08 DIAGNOSIS — K861 Other chronic pancreatitis: Secondary | ICD-10-CM | POA: Diagnosis not present

## 2016-01-08 DIAGNOSIS — R12 Heartburn: Secondary | ICD-10-CM | POA: Diagnosis not present

## 2016-01-08 DIAGNOSIS — K5010 Crohn's disease of large intestine without complications: Secondary | ICD-10-CM | POA: Diagnosis not present

## 2016-01-21 DIAGNOSIS — M25569 Pain in unspecified knee: Secondary | ICD-10-CM | POA: Diagnosis not present

## 2016-01-30 ENCOUNTER — Other Ambulatory Visit: Payer: Self-pay

## 2016-02-11 DIAGNOSIS — K5010 Crohn's disease of large intestine without complications: Secondary | ICD-10-CM | POA: Diagnosis not present

## 2016-04-30 DIAGNOSIS — Z86010 Personal history of colonic polyps: Secondary | ICD-10-CM | POA: Diagnosis not present

## 2016-04-30 DIAGNOSIS — Z1211 Encounter for screening for malignant neoplasm of colon: Secondary | ICD-10-CM | POA: Diagnosis not present

## 2016-04-30 DIAGNOSIS — K5010 Crohn's disease of large intestine without complications: Secondary | ICD-10-CM | POA: Diagnosis not present

## 2016-04-30 DIAGNOSIS — K5730 Diverticulosis of large intestine without perforation or abscess without bleeding: Secondary | ICD-10-CM | POA: Diagnosis not present

## 2016-11-15 DIAGNOSIS — R42 Dizziness and giddiness: Secondary | ICD-10-CM | POA: Diagnosis not present

## 2016-11-15 DIAGNOSIS — E860 Dehydration: Secondary | ICD-10-CM | POA: Diagnosis not present

## 2016-11-15 DIAGNOSIS — S80811A Abrasion, right lower leg, initial encounter: Secondary | ICD-10-CM | POA: Diagnosis not present

## 2017-02-02 DIAGNOSIS — H5201 Hypermetropia, right eye: Secondary | ICD-10-CM | POA: Diagnosis not present

## 2017-02-02 DIAGNOSIS — H26492 Other secondary cataract, left eye: Secondary | ICD-10-CM | POA: Diagnosis not present

## 2017-02-02 DIAGNOSIS — Z961 Presence of intraocular lens: Secondary | ICD-10-CM | POA: Diagnosis not present

## 2017-02-02 DIAGNOSIS — Z9841 Cataract extraction status, right eye: Secondary | ICD-10-CM | POA: Diagnosis not present

## 2017-02-02 DIAGNOSIS — H43393 Other vitreous opacities, bilateral: Secondary | ICD-10-CM | POA: Diagnosis not present

## 2017-02-02 DIAGNOSIS — H52221 Regular astigmatism, right eye: Secondary | ICD-10-CM | POA: Diagnosis not present

## 2017-02-02 DIAGNOSIS — H43813 Vitreous degeneration, bilateral: Secondary | ICD-10-CM | POA: Diagnosis not present

## 2017-02-02 DIAGNOSIS — I10 Essential (primary) hypertension: Secondary | ICD-10-CM | POA: Diagnosis not present

## 2017-02-02 DIAGNOSIS — H524 Presbyopia: Secondary | ICD-10-CM | POA: Diagnosis not present

## 2017-07-18 DIAGNOSIS — R011 Cardiac murmur, unspecified: Secondary | ICD-10-CM | POA: Insufficient documentation

## 2017-07-18 DIAGNOSIS — R0609 Other forms of dyspnea: Secondary | ICD-10-CM | POA: Insufficient documentation

## 2017-07-18 DIAGNOSIS — R0789 Other chest pain: Secondary | ICD-10-CM | POA: Insufficient documentation

## 2019-06-04 DIAGNOSIS — R0600 Dyspnea, unspecified: Secondary | ICD-10-CM | POA: Diagnosis not present

## 2019-06-04 DIAGNOSIS — I10 Essential (primary) hypertension: Secondary | ICD-10-CM | POA: Diagnosis not present

## 2019-06-04 DIAGNOSIS — E782 Mixed hyperlipidemia: Secondary | ICD-10-CM | POA: Diagnosis not present

## 2019-07-16 DIAGNOSIS — R0600 Dyspnea, unspecified: Secondary | ICD-10-CM | POA: Diagnosis not present

## 2019-07-16 DIAGNOSIS — I10 Essential (primary) hypertension: Secondary | ICD-10-CM | POA: Diagnosis not present

## 2019-07-16 DIAGNOSIS — E782 Mixed hyperlipidemia: Secondary | ICD-10-CM | POA: Diagnosis not present

## 2019-07-17 DIAGNOSIS — Z6827 Body mass index (BMI) 27.0-27.9, adult: Secondary | ICD-10-CM | POA: Diagnosis not present

## 2019-07-17 DIAGNOSIS — D229 Melanocytic nevi, unspecified: Secondary | ICD-10-CM | POA: Diagnosis not present

## 2019-07-23 DIAGNOSIS — L814 Other melanin hyperpigmentation: Secondary | ICD-10-CM | POA: Diagnosis not present

## 2019-07-23 DIAGNOSIS — L821 Other seborrheic keratosis: Secondary | ICD-10-CM | POA: Diagnosis not present

## 2019-07-23 DIAGNOSIS — D2239 Melanocytic nevi of other parts of face: Secondary | ICD-10-CM | POA: Diagnosis not present

## 2019-07-23 DIAGNOSIS — D485 Neoplasm of uncertain behavior of skin: Secondary | ICD-10-CM | POA: Diagnosis not present

## 2019-07-23 DIAGNOSIS — D225 Melanocytic nevi of trunk: Secondary | ICD-10-CM | POA: Diagnosis not present

## 2019-08-02 DIAGNOSIS — R972 Elevated prostate specific antigen [PSA]: Secondary | ICD-10-CM | POA: Diagnosis not present

## 2019-08-09 DIAGNOSIS — N401 Enlarged prostate with lower urinary tract symptoms: Secondary | ICD-10-CM | POA: Diagnosis not present

## 2019-08-09 DIAGNOSIS — R972 Elevated prostate specific antigen [PSA]: Secondary | ICD-10-CM | POA: Diagnosis not present

## 2019-08-09 DIAGNOSIS — N5201 Erectile dysfunction due to arterial insufficiency: Secondary | ICD-10-CM | POA: Diagnosis not present

## 2019-08-09 DIAGNOSIS — R3915 Urgency of urination: Secondary | ICD-10-CM | POA: Diagnosis not present

## 2019-09-24 DIAGNOSIS — L608 Other nail disorders: Secondary | ICD-10-CM | POA: Diagnosis not present

## 2019-09-24 DIAGNOSIS — Z6828 Body mass index (BMI) 28.0-28.9, adult: Secondary | ICD-10-CM | POA: Diagnosis not present

## 2019-10-08 ENCOUNTER — Ambulatory Visit: Payer: Medicare PPO | Admitting: Podiatry

## 2019-10-08 ENCOUNTER — Other Ambulatory Visit: Payer: Self-pay

## 2019-10-08 DIAGNOSIS — L603 Nail dystrophy: Secondary | ICD-10-CM | POA: Diagnosis not present

## 2019-10-08 DIAGNOSIS — L601 Onycholysis: Secondary | ICD-10-CM

## 2019-10-08 DIAGNOSIS — E785 Hyperlipidemia, unspecified: Secondary | ICD-10-CM | POA: Insufficient documentation

## 2019-10-08 DIAGNOSIS — I10 Essential (primary) hypertension: Secondary | ICD-10-CM | POA: Insufficient documentation

## 2019-10-08 DIAGNOSIS — K5190 Ulcerative colitis, unspecified, without complications: Secondary | ICD-10-CM | POA: Insufficient documentation

## 2019-10-08 NOTE — Progress Notes (Signed)
  Subjective:  Patient ID: Michael Houston, male    DOB: May 03, 1946,  MRN: [id number]  No chief complaint on file.   74 y.o. male presents with the above complaint. History confirmed with patient. States the right hallux nail fell off a couple weeks ago. States the nail was loose. Denies redness, swelling, drainage. Tried epsom salt.  Objective:  Physical Exam: warm, good capillary refill, no trophic changes or ulcerative lesions, normal DP and PT pulses and normal sensory exam. Right hallux anychia, left hallux proximal lysis Left Foot: normal exam, no swelling, tenderness, instability; ligaments intact, full range of motion of all ankle/foot joints  Right Foot: normal exam, no swelling, tenderness, instability; ligaments intact, full range of motion of all ankle/foot joints   Assessment:   1. Nail dystrophy   2. Onycholysis      Plan:  Patient was evaluated and treated and all questions answered.  Onyhcolysis -Left hallux nail debrided. Discussed possible need for removal at later date -Right hallux nail without nail bed injury. -F/u in 6 weeks for recheck.  No follow-ups on file.

## 2019-11-19 ENCOUNTER — Ambulatory Visit: Payer: Medicare PPO | Admitting: Podiatry

## 2019-11-19 DIAGNOSIS — I10 Essential (primary) hypertension: Secondary | ICD-10-CM | POA: Diagnosis not present

## 2019-11-19 DIAGNOSIS — E782 Mixed hyperlipidemia: Secondary | ICD-10-CM | POA: Diagnosis not present

## 2019-11-20 ENCOUNTER — Other Ambulatory Visit: Payer: Self-pay

## 2019-11-20 ENCOUNTER — Ambulatory Visit: Payer: Medicare PPO | Admitting: Podiatry

## 2019-11-20 DIAGNOSIS — L603 Nail dystrophy: Secondary | ICD-10-CM

## 2019-11-20 DIAGNOSIS — L601 Onycholysis: Secondary | ICD-10-CM

## 2019-11-26 DIAGNOSIS — Z6828 Body mass index (BMI) 28.0-28.9, adult: Secondary | ICD-10-CM | POA: Diagnosis not present

## 2019-11-26 DIAGNOSIS — K219 Gastro-esophageal reflux disease without esophagitis: Secondary | ICD-10-CM | POA: Diagnosis not present

## 2019-11-26 DIAGNOSIS — E1129 Type 2 diabetes mellitus with other diabetic kidney complication: Secondary | ICD-10-CM | POA: Diagnosis not present

## 2019-11-26 DIAGNOSIS — I10 Essential (primary) hypertension: Secondary | ICD-10-CM | POA: Diagnosis not present

## 2019-11-26 DIAGNOSIS — M109 Gout, unspecified: Secondary | ICD-10-CM | POA: Diagnosis not present

## 2019-12-02 NOTE — Progress Notes (Signed)
  Subjective:  Patient ID: Michael Houston, male    DOB: [birth year],  MRN: [id number]  Chief Complaint  Patient presents with  . nail check    F/U Rt hallux nail check pt. states," it doesn't bother me at all." -pt denies pain/redness/swelling/draiange tx: none     74 y.o. male presents for follow up of nail procedure. History confirmed with patient.   Objective:  Physical Exam: Right hallux nail: new growth, no continued lysis Assessment:   1. Nail dystrophy   2. Onycholysis      Plan:  Patient was evaluated and treated and all questions answered.  Onycholysis, right -Healing well without issue. -Discussed return precautions. -F/u PRN

## 2019-12-05 DIAGNOSIS — M25512 Pain in left shoulder: Secondary | ICD-10-CM | POA: Diagnosis not present

## 2019-12-05 DIAGNOSIS — M19012 Primary osteoarthritis, left shoulder: Secondary | ICD-10-CM | POA: Diagnosis not present

## 2019-12-06 DIAGNOSIS — K5010 Crohn's disease of large intestine without complications: Secondary | ICD-10-CM | POA: Diagnosis not present

## 2019-12-06 DIAGNOSIS — R197 Diarrhea, unspecified: Secondary | ICD-10-CM | POA: Diagnosis not present

## 2019-12-07 DIAGNOSIS — R197 Diarrhea, unspecified: Secondary | ICD-10-CM | POA: Diagnosis not present

## 2019-12-10 DIAGNOSIS — R197 Diarrhea, unspecified: Secondary | ICD-10-CM | POA: Diagnosis not present

## 2020-01-22 DIAGNOSIS — K5010 Crohn's disease of large intestine without complications: Secondary | ICD-10-CM | POA: Diagnosis not present

## 2020-02-12 DIAGNOSIS — R972 Elevated prostate specific antigen [PSA]: Secondary | ICD-10-CM | POA: Diagnosis not present

## 2020-02-18 DIAGNOSIS — R3912 Poor urinary stream: Secondary | ICD-10-CM | POA: Diagnosis not present

## 2020-02-18 DIAGNOSIS — R351 Nocturia: Secondary | ICD-10-CM | POA: Diagnosis not present

## 2020-02-18 DIAGNOSIS — N401 Enlarged prostate with lower urinary tract symptoms: Secondary | ICD-10-CM | POA: Diagnosis not present

## 2020-02-18 DIAGNOSIS — R3915 Urgency of urination: Secondary | ICD-10-CM | POA: Diagnosis not present

## 2020-02-18 DIAGNOSIS — N5201 Erectile dysfunction due to arterial insufficiency: Secondary | ICD-10-CM | POA: Diagnosis not present

## 2020-03-18 DIAGNOSIS — S61250A Open bite of right index finger without damage to nail, initial encounter: Secondary | ICD-10-CM | POA: Diagnosis not present

## 2020-03-18 DIAGNOSIS — S62600A Fracture of unspecified phalanx of right index finger, initial encounter for closed fracture: Secondary | ICD-10-CM | POA: Diagnosis not present

## 2020-03-26 DIAGNOSIS — N5201 Erectile dysfunction due to arterial insufficiency: Secondary | ICD-10-CM | POA: Diagnosis not present

## 2020-03-26 DIAGNOSIS — R351 Nocturia: Secondary | ICD-10-CM | POA: Diagnosis not present

## 2020-03-26 DIAGNOSIS — N401 Enlarged prostate with lower urinary tract symptoms: Secondary | ICD-10-CM | POA: Diagnosis not present

## 2020-03-26 DIAGNOSIS — R350 Frequency of micturition: Secondary | ICD-10-CM | POA: Diagnosis not present

## 2020-04-16 DIAGNOSIS — R351 Nocturia: Secondary | ICD-10-CM | POA: Diagnosis not present

## 2020-04-16 DIAGNOSIS — N401 Enlarged prostate with lower urinary tract symptoms: Secondary | ICD-10-CM | POA: Diagnosis not present

## 2020-05-16 DIAGNOSIS — R351 Nocturia: Secondary | ICD-10-CM | POA: Diagnosis not present

## 2020-05-16 DIAGNOSIS — N401 Enlarged prostate with lower urinary tract symptoms: Secondary | ICD-10-CM | POA: Diagnosis not present

## 2020-06-06 DIAGNOSIS — E782 Mixed hyperlipidemia: Secondary | ICD-10-CM | POA: Diagnosis not present

## 2020-06-06 DIAGNOSIS — I10 Essential (primary) hypertension: Secondary | ICD-10-CM | POA: Diagnosis not present

## 2020-06-25 DIAGNOSIS — I10 Essential (primary) hypertension: Secondary | ICD-10-CM | POA: Diagnosis not present

## 2020-07-24 DIAGNOSIS — E119 Type 2 diabetes mellitus without complications: Secondary | ICD-10-CM | POA: Diagnosis not present

## 2020-07-24 DIAGNOSIS — Z79899 Other long term (current) drug therapy: Secondary | ICD-10-CM | POA: Diagnosis not present

## 2020-07-24 DIAGNOSIS — R1110 Vomiting, unspecified: Secondary | ICD-10-CM | POA: Diagnosis not present

## 2020-07-24 DIAGNOSIS — R4182 Altered mental status, unspecified: Secondary | ICD-10-CM | POA: Diagnosis not present

## 2020-07-24 DIAGNOSIS — R110 Nausea: Secondary | ICD-10-CM | POA: Diagnosis not present

## 2020-07-24 DIAGNOSIS — R531 Weakness: Secondary | ICD-10-CM | POA: Diagnosis not present

## 2020-07-24 DIAGNOSIS — R030 Elevated blood-pressure reading, without diagnosis of hypertension: Secondary | ICD-10-CM | POA: Diagnosis not present

## 2020-07-24 DIAGNOSIS — Z7984 Long term (current) use of oral hypoglycemic drugs: Secondary | ICD-10-CM | POA: Diagnosis not present

## 2020-07-24 DIAGNOSIS — R112 Nausea with vomiting, unspecified: Secondary | ICD-10-CM | POA: Diagnosis not present

## 2020-07-24 DIAGNOSIS — Z880 Allergy status to penicillin: Secondary | ICD-10-CM | POA: Diagnosis not present

## 2020-07-24 DIAGNOSIS — I10 Essential (primary) hypertension: Secondary | ICD-10-CM | POA: Diagnosis not present

## 2020-07-24 DIAGNOSIS — Z91041 Radiographic dye allergy status: Secondary | ICD-10-CM | POA: Diagnosis not present

## 2020-07-24 DIAGNOSIS — R42 Dizziness and giddiness: Secondary | ICD-10-CM | POA: Diagnosis not present

## 2020-07-24 DIAGNOSIS — I161 Hypertensive emergency: Secondary | ICD-10-CM | POA: Diagnosis not present

## 2020-07-24 DIAGNOSIS — R0602 Shortness of breath: Secondary | ICD-10-CM | POA: Diagnosis not present

## 2020-07-24 DIAGNOSIS — E7800 Pure hypercholesterolemia, unspecified: Secondary | ICD-10-CM | POA: Diagnosis not present

## 2020-07-25 DIAGNOSIS — R42 Dizziness and giddiness: Secondary | ICD-10-CM | POA: Diagnosis not present

## 2020-07-25 DIAGNOSIS — R4182 Altered mental status, unspecified: Secondary | ICD-10-CM | POA: Diagnosis not present

## 2020-07-30 DIAGNOSIS — K5010 Crohn's disease of large intestine without complications: Secondary | ICD-10-CM | POA: Diagnosis not present

## 2020-07-31 DIAGNOSIS — E1129 Type 2 diabetes mellitus with other diabetic kidney complication: Secondary | ICD-10-CM | POA: Diagnosis not present

## 2020-07-31 DIAGNOSIS — Z0000 Encounter for general adult medical examination without abnormal findings: Secondary | ICD-10-CM | POA: Diagnosis not present

## 2020-07-31 DIAGNOSIS — K5090 Crohn's disease, unspecified, without complications: Secondary | ICD-10-CM | POA: Diagnosis not present

## 2020-07-31 DIAGNOSIS — H8110 Benign paroxysmal vertigo, unspecified ear: Secondary | ICD-10-CM | POA: Diagnosis not present

## 2020-07-31 DIAGNOSIS — Z1331 Encounter for screening for depression: Secondary | ICD-10-CM | POA: Diagnosis not present

## 2020-07-31 DIAGNOSIS — Z6828 Body mass index (BMI) 28.0-28.9, adult: Secondary | ICD-10-CM | POA: Diagnosis not present

## 2020-08-05 DIAGNOSIS — K5010 Crohn's disease of large intestine without complications: Secondary | ICD-10-CM | POA: Diagnosis not present

## 2020-08-05 DIAGNOSIS — R197 Diarrhea, unspecified: Secondary | ICD-10-CM | POA: Diagnosis not present

## 2020-08-05 DIAGNOSIS — K219 Gastro-esophageal reflux disease without esophagitis: Secondary | ICD-10-CM | POA: Diagnosis not present

## 2020-08-13 DIAGNOSIS — R2681 Unsteadiness on feet: Secondary | ICD-10-CM | POA: Diagnosis not present

## 2020-08-13 DIAGNOSIS — M6281 Muscle weakness (generalized): Secondary | ICD-10-CM | POA: Diagnosis not present

## 2020-08-13 DIAGNOSIS — H8110 Benign paroxysmal vertigo, unspecified ear: Secondary | ICD-10-CM | POA: Diagnosis not present

## 2020-08-21 DIAGNOSIS — M6281 Muscle weakness (generalized): Secondary | ICD-10-CM | POA: Diagnosis not present

## 2020-08-21 DIAGNOSIS — H8110 Benign paroxysmal vertigo, unspecified ear: Secondary | ICD-10-CM | POA: Diagnosis not present

## 2020-08-21 DIAGNOSIS — R2681 Unsteadiness on feet: Secondary | ICD-10-CM | POA: Diagnosis not present

## 2020-08-26 DIAGNOSIS — H8110 Benign paroxysmal vertigo, unspecified ear: Secondary | ICD-10-CM | POA: Diagnosis not present

## 2020-08-26 DIAGNOSIS — R2681 Unsteadiness on feet: Secondary | ICD-10-CM | POA: Diagnosis not present

## 2020-08-26 DIAGNOSIS — M6281 Muscle weakness (generalized): Secondary | ICD-10-CM | POA: Diagnosis not present

## 2020-09-24 DIAGNOSIS — M6281 Muscle weakness (generalized): Secondary | ICD-10-CM | POA: Diagnosis not present

## 2020-09-24 DIAGNOSIS — I10 Essential (primary) hypertension: Secondary | ICD-10-CM | POA: Diagnosis not present

## 2020-09-24 DIAGNOSIS — H8110 Benign paroxysmal vertigo, unspecified ear: Secondary | ICD-10-CM | POA: Diagnosis not present

## 2020-09-24 DIAGNOSIS — E782 Mixed hyperlipidemia: Secondary | ICD-10-CM | POA: Diagnosis not present

## 2020-09-24 DIAGNOSIS — R2681 Unsteadiness on feet: Secondary | ICD-10-CM | POA: Diagnosis not present

## 2020-10-01 DIAGNOSIS — Z86010 Personal history of colonic polyps: Secondary | ICD-10-CM | POA: Diagnosis not present

## 2020-10-01 DIAGNOSIS — K5010 Crohn's disease of large intestine without complications: Secondary | ICD-10-CM | POA: Diagnosis not present

## 2020-10-02 DIAGNOSIS — M6281 Muscle weakness (generalized): Secondary | ICD-10-CM | POA: Diagnosis not present

## 2020-10-02 DIAGNOSIS — H8110 Benign paroxysmal vertigo, unspecified ear: Secondary | ICD-10-CM | POA: Diagnosis not present

## 2020-10-02 DIAGNOSIS — R2681 Unsteadiness on feet: Secondary | ICD-10-CM | POA: Diagnosis not present

## 2020-10-22 DIAGNOSIS — M25512 Pain in left shoulder: Secondary | ICD-10-CM | POA: Diagnosis not present

## 2020-10-22 DIAGNOSIS — M19012 Primary osteoarthritis, left shoulder: Secondary | ICD-10-CM | POA: Diagnosis not present

## 2020-10-22 DIAGNOSIS — G8929 Other chronic pain: Secondary | ICD-10-CM | POA: Diagnosis not present

## 2020-11-13 DIAGNOSIS — M25512 Pain in left shoulder: Secondary | ICD-10-CM | POA: Diagnosis not present

## 2020-11-13 DIAGNOSIS — G8929 Other chronic pain: Secondary | ICD-10-CM | POA: Diagnosis not present

## 2020-11-13 DIAGNOSIS — M19012 Primary osteoarthritis, left shoulder: Secondary | ICD-10-CM | POA: Diagnosis not present

## 2020-11-14 DIAGNOSIS — R3914 Feeling of incomplete bladder emptying: Secondary | ICD-10-CM | POA: Diagnosis not present

## 2020-11-14 DIAGNOSIS — R351 Nocturia: Secondary | ICD-10-CM | POA: Diagnosis not present

## 2020-11-14 DIAGNOSIS — N401 Enlarged prostate with lower urinary tract symptoms: Secondary | ICD-10-CM | POA: Diagnosis not present

## 2020-11-26 DIAGNOSIS — E1142 Type 2 diabetes mellitus with diabetic polyneuropathy: Secondary | ICD-10-CM | POA: Diagnosis not present

## 2020-11-26 DIAGNOSIS — I10 Essential (primary) hypertension: Secondary | ICD-10-CM | POA: Diagnosis not present

## 2020-12-22 DIAGNOSIS — I10 Essential (primary) hypertension: Secondary | ICD-10-CM | POA: Diagnosis not present

## 2021-01-15 DIAGNOSIS — Z6828 Body mass index (BMI) 28.0-28.9, adult: Secondary | ICD-10-CM | POA: Diagnosis not present

## 2021-01-15 DIAGNOSIS — K219 Gastro-esophageal reflux disease without esophagitis: Secondary | ICD-10-CM | POA: Diagnosis not present

## 2021-01-15 DIAGNOSIS — E1129 Type 2 diabetes mellitus with other diabetic kidney complication: Secondary | ICD-10-CM | POA: Diagnosis not present

## 2021-01-15 DIAGNOSIS — K5010 Crohn's disease of large intestine without complications: Secondary | ICD-10-CM | POA: Diagnosis not present

## 2021-01-15 DIAGNOSIS — M109 Gout, unspecified: Secondary | ICD-10-CM | POA: Diagnosis not present

## 2021-01-15 DIAGNOSIS — I10 Essential (primary) hypertension: Secondary | ICD-10-CM | POA: Diagnosis not present

## 2021-01-21 DIAGNOSIS — M25512 Pain in left shoulder: Secondary | ICD-10-CM | POA: Diagnosis not present

## 2021-01-21 DIAGNOSIS — G8929 Other chronic pain: Secondary | ICD-10-CM | POA: Diagnosis not present

## 2021-01-21 DIAGNOSIS — M19012 Primary osteoarthritis, left shoulder: Secondary | ICD-10-CM | POA: Diagnosis not present

## 2021-01-26 DIAGNOSIS — E782 Mixed hyperlipidemia: Secondary | ICD-10-CM | POA: Diagnosis not present

## 2021-01-26 DIAGNOSIS — I10 Essential (primary) hypertension: Secondary | ICD-10-CM | POA: Diagnosis not present

## 2021-01-28 DIAGNOSIS — I10 Essential (primary) hypertension: Secondary | ICD-10-CM | POA: Diagnosis not present

## 2021-01-28 DIAGNOSIS — E119 Type 2 diabetes mellitus without complications: Secondary | ICD-10-CM | POA: Diagnosis not present

## 2021-02-03 DIAGNOSIS — M25512 Pain in left shoulder: Secondary | ICD-10-CM | POA: Diagnosis not present

## 2021-02-03 DIAGNOSIS — M25412 Effusion, left shoulder: Secondary | ICD-10-CM | POA: Diagnosis not present

## 2021-02-03 DIAGNOSIS — M19012 Primary osteoarthritis, left shoulder: Secondary | ICD-10-CM | POA: Diagnosis not present

## 2021-02-03 DIAGNOSIS — M7552 Bursitis of left shoulder: Secondary | ICD-10-CM | POA: Diagnosis not present

## 2021-02-17 DIAGNOSIS — M19012 Primary osteoarthritis, left shoulder: Secondary | ICD-10-CM | POA: Diagnosis not present

## 2021-02-17 DIAGNOSIS — M7552 Bursitis of left shoulder: Secondary | ICD-10-CM | POA: Diagnosis not present

## 2021-02-17 DIAGNOSIS — G8929 Other chronic pain: Secondary | ICD-10-CM | POA: Diagnosis not present

## 2021-02-17 DIAGNOSIS — M25512 Pain in left shoulder: Secondary | ICD-10-CM | POA: Diagnosis not present

## 2021-02-20 DIAGNOSIS — S90211A Contusion of right great toe with damage to nail, initial encounter: Secondary | ICD-10-CM | POA: Diagnosis not present

## 2021-02-25 DIAGNOSIS — E1165 Type 2 diabetes mellitus with hyperglycemia: Secondary | ICD-10-CM | POA: Diagnosis not present

## 2021-02-25 DIAGNOSIS — I10 Essential (primary) hypertension: Secondary | ICD-10-CM | POA: Diagnosis not present

## 2021-03-11 DIAGNOSIS — I872 Venous insufficiency (chronic) (peripheral): Secondary | ICD-10-CM | POA: Diagnosis not present

## 2021-03-11 DIAGNOSIS — Z7984 Long term (current) use of oral hypoglycemic drugs: Secondary | ICD-10-CM | POA: Diagnosis not present

## 2021-03-11 DIAGNOSIS — Z9989 Dependence on other enabling machines and devices: Secondary | ICD-10-CM | POA: Diagnosis not present

## 2021-03-11 DIAGNOSIS — G4733 Obstructive sleep apnea (adult) (pediatric): Secondary | ICD-10-CM | POA: Diagnosis not present

## 2021-03-11 DIAGNOSIS — I10 Essential (primary) hypertension: Secondary | ICD-10-CM | POA: Diagnosis not present

## 2021-03-11 DIAGNOSIS — E119 Type 2 diabetes mellitus without complications: Secondary | ICD-10-CM | POA: Diagnosis not present

## 2021-03-18 DIAGNOSIS — I10 Essential (primary) hypertension: Secondary | ICD-10-CM | POA: Diagnosis not present

## 2021-03-18 DIAGNOSIS — E1165 Type 2 diabetes mellitus with hyperglycemia: Secondary | ICD-10-CM | POA: Diagnosis not present

## 2021-03-31 DIAGNOSIS — R1310 Dysphagia, unspecified: Secondary | ICD-10-CM | POA: Diagnosis not present

## 2021-03-31 DIAGNOSIS — K219 Gastro-esophageal reflux disease without esophagitis: Secondary | ICD-10-CM | POA: Diagnosis not present

## 2021-03-31 DIAGNOSIS — K5010 Crohn's disease of large intestine without complications: Secondary | ICD-10-CM | POA: Diagnosis not present

## 2021-04-02 DIAGNOSIS — K225 Diverticulum of esophagus, acquired: Secondary | ICD-10-CM | POA: Diagnosis not present

## 2021-04-02 DIAGNOSIS — R1310 Dysphagia, unspecified: Secondary | ICD-10-CM | POA: Diagnosis not present

## 2021-04-02 DIAGNOSIS — T17320A Food in larynx causing asphyxiation, initial encounter: Secondary | ICD-10-CM | POA: Diagnosis not present

## 2021-04-02 DIAGNOSIS — K224 Dyskinesia of esophagus: Secondary | ICD-10-CM | POA: Diagnosis not present

## 2021-04-02 DIAGNOSIS — K449 Diaphragmatic hernia without obstruction or gangrene: Secondary | ICD-10-CM | POA: Diagnosis not present

## 2021-04-06 DIAGNOSIS — Z6827 Body mass index (BMI) 27.0-27.9, adult: Secondary | ICD-10-CM | POA: Diagnosis not present

## 2021-04-06 DIAGNOSIS — E1129 Type 2 diabetes mellitus with other diabetic kidney complication: Secondary | ICD-10-CM | POA: Diagnosis not present

## 2021-04-15 DIAGNOSIS — I10 Essential (primary) hypertension: Secondary | ICD-10-CM | POA: Diagnosis not present

## 2021-04-15 DIAGNOSIS — R490 Dysphonia: Secondary | ICD-10-CM | POA: Diagnosis not present

## 2021-04-15 DIAGNOSIS — J383 Other diseases of vocal cords: Secondary | ICD-10-CM | POA: Diagnosis not present

## 2021-04-15 DIAGNOSIS — K225 Diverticulum of esophagus, acquired: Secondary | ICD-10-CM | POA: Diagnosis not present

## 2021-04-15 DIAGNOSIS — J387 Other diseases of larynx: Secondary | ICD-10-CM | POA: Diagnosis not present

## 2021-04-15 DIAGNOSIS — J384 Edema of larynx: Secondary | ICD-10-CM | POA: Diagnosis not present

## 2021-04-15 DIAGNOSIS — J330 Polyp of nasal cavity: Secondary | ICD-10-CM | POA: Diagnosis not present

## 2021-04-15 DIAGNOSIS — J339 Nasal polyp, unspecified: Secondary | ICD-10-CM | POA: Diagnosis not present

## 2021-04-15 DIAGNOSIS — I872 Venous insufficiency (chronic) (peripheral): Secondary | ICD-10-CM | POA: Diagnosis not present

## 2021-04-16 DIAGNOSIS — I160 Hypertensive urgency: Secondary | ICD-10-CM | POA: Diagnosis not present

## 2021-04-16 DIAGNOSIS — I672 Cerebral atherosclerosis: Secondary | ICD-10-CM | POA: Diagnosis not present

## 2021-04-16 DIAGNOSIS — R519 Headache, unspecified: Secondary | ICD-10-CM | POA: Diagnosis not present

## 2021-04-16 DIAGNOSIS — R079 Chest pain, unspecified: Secondary | ICD-10-CM | POA: Diagnosis not present

## 2021-04-16 DIAGNOSIS — I10 Essential (primary) hypertension: Secondary | ICD-10-CM | POA: Diagnosis not present

## 2021-04-16 DIAGNOSIS — S2231XA Fracture of one rib, right side, initial encounter for closed fracture: Secondary | ICD-10-CM | POA: Diagnosis not present

## 2021-04-16 DIAGNOSIS — R0602 Shortness of breath: Secondary | ICD-10-CM | POA: Diagnosis not present

## 2021-04-21 DIAGNOSIS — Z6827 Body mass index (BMI) 27.0-27.9, adult: Secondary | ICD-10-CM | POA: Diagnosis not present

## 2021-04-21 DIAGNOSIS — I10 Essential (primary) hypertension: Secondary | ICD-10-CM | POA: Diagnosis not present

## 2021-04-29 DIAGNOSIS — I10 Essential (primary) hypertension: Secondary | ICD-10-CM | POA: Diagnosis not present

## 2021-04-29 DIAGNOSIS — E1165 Type 2 diabetes mellitus with hyperglycemia: Secondary | ICD-10-CM | POA: Diagnosis not present

## 2021-05-08 DIAGNOSIS — N401 Enlarged prostate with lower urinary tract symptoms: Secondary | ICD-10-CM | POA: Diagnosis not present

## 2021-05-12 DIAGNOSIS — E249 Cushing's syndrome, unspecified: Secondary | ICD-10-CM | POA: Diagnosis not present

## 2021-05-12 DIAGNOSIS — I10 Essential (primary) hypertension: Secondary | ICD-10-CM | POA: Diagnosis not present

## 2021-05-12 DIAGNOSIS — I872 Venous insufficiency (chronic) (peripheral): Secondary | ICD-10-CM | POA: Diagnosis not present

## 2021-05-15 DIAGNOSIS — R972 Elevated prostate specific antigen [PSA]: Secondary | ICD-10-CM | POA: Diagnosis not present

## 2021-05-15 DIAGNOSIS — R351 Nocturia: Secondary | ICD-10-CM | POA: Diagnosis not present

## 2021-05-15 DIAGNOSIS — N401 Enlarged prostate with lower urinary tract symptoms: Secondary | ICD-10-CM | POA: Diagnosis not present

## 2021-05-19 DIAGNOSIS — M7582 Other shoulder lesions, left shoulder: Secondary | ICD-10-CM | POA: Diagnosis not present

## 2021-05-30 DIAGNOSIS — E1165 Type 2 diabetes mellitus with hyperglycemia: Secondary | ICD-10-CM | POA: Diagnosis not present

## 2021-05-30 DIAGNOSIS — E1142 Type 2 diabetes mellitus with diabetic polyneuropathy: Secondary | ICD-10-CM | POA: Diagnosis not present

## 2021-05-30 DIAGNOSIS — I10 Essential (primary) hypertension: Secondary | ICD-10-CM | POA: Diagnosis not present

## 2021-05-30 DIAGNOSIS — E119 Type 2 diabetes mellitus without complications: Secondary | ICD-10-CM | POA: Diagnosis not present

## 2021-06-12 DIAGNOSIS — J3489 Other specified disorders of nose and nasal sinuses: Secondary | ICD-10-CM | POA: Diagnosis not present

## 2021-06-12 DIAGNOSIS — J339 Nasal polyp, unspecified: Secondary | ICD-10-CM | POA: Diagnosis not present

## 2021-06-12 DIAGNOSIS — J343 Hypertrophy of nasal turbinates: Secondary | ICD-10-CM | POA: Diagnosis not present

## 2021-06-12 DIAGNOSIS — J328 Other chronic sinusitis: Secondary | ICD-10-CM | POA: Diagnosis not present

## 2021-06-12 DIAGNOSIS — J329 Chronic sinusitis, unspecified: Secondary | ICD-10-CM | POA: Diagnosis not present

## 2021-06-24 DIAGNOSIS — I10 Essential (primary) hypertension: Secondary | ICD-10-CM | POA: Diagnosis not present

## 2021-06-24 DIAGNOSIS — Z79899 Other long term (current) drug therapy: Secondary | ICD-10-CM | POA: Diagnosis not present

## 2021-06-24 DIAGNOSIS — G4733 Obstructive sleep apnea (adult) (pediatric): Secondary | ICD-10-CM | POA: Diagnosis not present

## 2021-06-24 DIAGNOSIS — I872 Venous insufficiency (chronic) (peripheral): Secondary | ICD-10-CM | POA: Diagnosis not present

## 2021-06-24 DIAGNOSIS — R351 Nocturia: Secondary | ICD-10-CM | POA: Diagnosis not present

## 2021-06-29 DIAGNOSIS — L700 Acne vulgaris: Secondary | ICD-10-CM | POA: Diagnosis not present

## 2021-06-29 DIAGNOSIS — L821 Other seborrheic keratosis: Secondary | ICD-10-CM | POA: Diagnosis not present

## 2021-06-30 DIAGNOSIS — I10 Essential (primary) hypertension: Secondary | ICD-10-CM | POA: Diagnosis not present

## 2021-06-30 DIAGNOSIS — E1142 Type 2 diabetes mellitus with diabetic polyneuropathy: Secondary | ICD-10-CM | POA: Diagnosis not present

## 2021-06-30 DIAGNOSIS — E785 Hyperlipidemia, unspecified: Secondary | ICD-10-CM | POA: Diagnosis not present

## 2021-06-30 DIAGNOSIS — K219 Gastro-esophageal reflux disease without esophagitis: Secondary | ICD-10-CM | POA: Diagnosis not present

## 2021-07-28 DIAGNOSIS — E785 Hyperlipidemia, unspecified: Secondary | ICD-10-CM | POA: Diagnosis not present

## 2021-07-28 DIAGNOSIS — K219 Gastro-esophageal reflux disease without esophagitis: Secondary | ICD-10-CM | POA: Diagnosis not present

## 2021-07-28 DIAGNOSIS — I10 Essential (primary) hypertension: Secondary | ICD-10-CM | POA: Diagnosis not present

## 2021-07-28 DIAGNOSIS — E1142 Type 2 diabetes mellitus with diabetic polyneuropathy: Secondary | ICD-10-CM | POA: Diagnosis not present

## 2021-08-12 DIAGNOSIS — D140 Benign neoplasm of middle ear, nasal cavity and accessory sinuses: Secondary | ICD-10-CM | POA: Diagnosis not present

## 2021-08-12 DIAGNOSIS — E785 Hyperlipidemia, unspecified: Secondary | ICD-10-CM | POA: Diagnosis not present

## 2021-08-12 DIAGNOSIS — G4733 Obstructive sleep apnea (adult) (pediatric): Secondary | ICD-10-CM | POA: Diagnosis not present

## 2021-08-12 DIAGNOSIS — M109 Gout, unspecified: Secondary | ICD-10-CM | POA: Diagnosis not present

## 2021-08-12 DIAGNOSIS — D106 Benign neoplasm of nasopharynx: Secondary | ICD-10-CM | POA: Diagnosis not present

## 2021-08-12 DIAGNOSIS — J343 Hypertrophy of nasal turbinates: Secondary | ICD-10-CM | POA: Diagnosis not present

## 2021-08-12 DIAGNOSIS — I10 Essential (primary) hypertension: Secondary | ICD-10-CM | POA: Diagnosis not present

## 2021-08-12 DIAGNOSIS — K219 Gastro-esophageal reflux disease without esophagitis: Secondary | ICD-10-CM | POA: Diagnosis not present

## 2021-08-12 DIAGNOSIS — J3489 Other specified disorders of nose and nasal sinuses: Secondary | ICD-10-CM | POA: Diagnosis not present

## 2021-08-12 DIAGNOSIS — E119 Type 2 diabetes mellitus without complications: Secondary | ICD-10-CM | POA: Diagnosis not present

## 2021-08-20 DIAGNOSIS — Z48813 Encounter for surgical aftercare following surgery on the respiratory system: Secondary | ICD-10-CM | POA: Diagnosis not present

## 2021-08-20 DIAGNOSIS — Z9889 Other specified postprocedural states: Secondary | ICD-10-CM | POA: Diagnosis not present

## 2021-08-20 DIAGNOSIS — J3489 Other specified disorders of nose and nasal sinuses: Secondary | ICD-10-CM | POA: Diagnosis not present

## 2021-08-20 DIAGNOSIS — J343 Hypertrophy of nasal turbinates: Secondary | ICD-10-CM | POA: Diagnosis not present

## 2021-08-28 DIAGNOSIS — Z1331 Encounter for screening for depression: Secondary | ICD-10-CM | POA: Diagnosis not present

## 2021-08-28 DIAGNOSIS — E1129 Type 2 diabetes mellitus with other diabetic kidney complication: Secondary | ICD-10-CM | POA: Diagnosis not present

## 2021-08-28 DIAGNOSIS — Z0000 Encounter for general adult medical examination without abnormal findings: Secondary | ICD-10-CM | POA: Diagnosis not present

## 2021-08-28 DIAGNOSIS — E1142 Type 2 diabetes mellitus with diabetic polyneuropathy: Secondary | ICD-10-CM | POA: Diagnosis not present

## 2021-08-28 DIAGNOSIS — Z6828 Body mass index (BMI) 28.0-28.9, adult: Secondary | ICD-10-CM | POA: Diagnosis not present

## 2021-08-28 DIAGNOSIS — K219 Gastro-esophageal reflux disease without esophagitis: Secondary | ICD-10-CM | POA: Diagnosis not present

## 2021-08-28 DIAGNOSIS — E785 Hyperlipidemia, unspecified: Secondary | ICD-10-CM | POA: Diagnosis not present

## 2021-08-28 DIAGNOSIS — I10 Essential (primary) hypertension: Secondary | ICD-10-CM | POA: Diagnosis not present

## 2021-09-08 DIAGNOSIS — Z48813 Encounter for surgical aftercare following surgery on the respiratory system: Secondary | ICD-10-CM | POA: Diagnosis not present

## 2021-09-08 DIAGNOSIS — J3489 Other specified disorders of nose and nasal sinuses: Secondary | ICD-10-CM | POA: Diagnosis not present

## 2021-09-08 DIAGNOSIS — Z9889 Other specified postprocedural states: Secondary | ICD-10-CM | POA: Diagnosis not present

## 2021-09-08 DIAGNOSIS — Z79899 Other long term (current) drug therapy: Secondary | ICD-10-CM | POA: Diagnosis not present

## 2021-09-08 DIAGNOSIS — Z48810 Encounter for surgical aftercare following surgery on the sense organs: Secondary | ICD-10-CM | POA: Diagnosis not present

## 2021-09-23 DIAGNOSIS — K5010 Crohn's disease of large intestine without complications: Secondary | ICD-10-CM | POA: Diagnosis not present

## 2021-09-27 DIAGNOSIS — E1142 Type 2 diabetes mellitus with diabetic polyneuropathy: Secondary | ICD-10-CM | POA: Diagnosis not present

## 2021-09-27 DIAGNOSIS — K219 Gastro-esophageal reflux disease without esophagitis: Secondary | ICD-10-CM | POA: Diagnosis not present

## 2021-09-27 DIAGNOSIS — I10 Essential (primary) hypertension: Secondary | ICD-10-CM | POA: Diagnosis not present

## 2021-09-27 DIAGNOSIS — E785 Hyperlipidemia, unspecified: Secondary | ICD-10-CM | POA: Diagnosis not present

## 2021-09-29 DIAGNOSIS — K5010 Crohn's disease of large intestine without complications: Secondary | ICD-10-CM | POA: Diagnosis not present

## 2021-09-29 DIAGNOSIS — K219 Gastro-esophageal reflux disease without esophagitis: Secondary | ICD-10-CM | POA: Diagnosis not present

## 2021-10-12 DIAGNOSIS — Z961 Presence of intraocular lens: Secondary | ICD-10-CM | POA: Diagnosis not present

## 2021-10-12 DIAGNOSIS — E119 Type 2 diabetes mellitus without complications: Secondary | ICD-10-CM | POA: Diagnosis not present

## 2021-10-12 DIAGNOSIS — Z7984 Long term (current) use of oral hypoglycemic drugs: Secondary | ICD-10-CM | POA: Diagnosis not present

## 2021-10-12 DIAGNOSIS — H26491 Other secondary cataract, right eye: Secondary | ICD-10-CM | POA: Diagnosis not present

## 2021-10-12 DIAGNOSIS — H52223 Regular astigmatism, bilateral: Secondary | ICD-10-CM | POA: Diagnosis not present

## 2021-10-12 DIAGNOSIS — I10 Essential (primary) hypertension: Secondary | ICD-10-CM | POA: Diagnosis not present

## 2021-10-12 DIAGNOSIS — H524 Presbyopia: Secondary | ICD-10-CM | POA: Diagnosis not present

## 2021-10-12 DIAGNOSIS — H26492 Other secondary cataract, left eye: Secondary | ICD-10-CM | POA: Diagnosis not present

## 2021-10-12 DIAGNOSIS — H5203 Hypermetropia, bilateral: Secondary | ICD-10-CM | POA: Diagnosis not present

## 2021-10-14 DIAGNOSIS — I872 Venous insufficiency (chronic) (peripheral): Secondary | ICD-10-CM | POA: Diagnosis not present

## 2021-10-14 DIAGNOSIS — I10 Essential (primary) hypertension: Secondary | ICD-10-CM | POA: Diagnosis not present

## 2021-10-15 DIAGNOSIS — R197 Diarrhea, unspecified: Secondary | ICD-10-CM | POA: Diagnosis not present

## 2021-10-15 DIAGNOSIS — Z6828 Body mass index (BMI) 28.0-28.9, adult: Secondary | ICD-10-CM | POA: Diagnosis not present

## 2021-10-28 DIAGNOSIS — E1142 Type 2 diabetes mellitus with diabetic polyneuropathy: Secondary | ICD-10-CM | POA: Diagnosis not present

## 2021-10-28 DIAGNOSIS — K219 Gastro-esophageal reflux disease without esophagitis: Secondary | ICD-10-CM | POA: Diagnosis not present

## 2021-10-28 DIAGNOSIS — I10 Essential (primary) hypertension: Secondary | ICD-10-CM | POA: Diagnosis not present

## 2021-10-28 DIAGNOSIS — E785 Hyperlipidemia, unspecified: Secondary | ICD-10-CM | POA: Diagnosis not present

## 2021-11-09 ENCOUNTER — Other Ambulatory Visit: Payer: Self-pay | Admitting: Family Medicine

## 2021-11-09 DIAGNOSIS — Z6828 Body mass index (BMI) 28.0-28.9, adult: Secondary | ICD-10-CM | POA: Diagnosis not present

## 2021-11-09 DIAGNOSIS — N63 Unspecified lump in unspecified breast: Secondary | ICD-10-CM | POA: Diagnosis not present

## 2021-11-09 DIAGNOSIS — N6320 Unspecified lump in the left breast, unspecified quadrant: Secondary | ICD-10-CM

## 2021-11-09 DIAGNOSIS — M1990 Unspecified osteoarthritis, unspecified site: Secondary | ICD-10-CM | POA: Diagnosis not present

## 2021-11-11 ENCOUNTER — Ambulatory Visit
Admission: RE | Admit: 2021-11-11 | Discharge: 2021-11-11 | Disposition: A | Discharge status: Home / Self Care | Payer: Medicare PPO | Source: Ambulatory Visit | Attending: Family Medicine | Admitting: Family Medicine

## 2021-11-11 ENCOUNTER — Ambulatory Visit: Payer: Self-pay

## 2021-11-11 DIAGNOSIS — N62 Hypertrophy of breast: Secondary | ICD-10-CM | POA: Diagnosis not present

## 2021-11-11 DIAGNOSIS — N6320 Unspecified lump in the left breast, unspecified quadrant: Secondary | ICD-10-CM

## 2021-11-13 ENCOUNTER — Other Ambulatory Visit: Payer: Self-pay

## 2021-11-13 DIAGNOSIS — R972 Elevated prostate specific antigen [PSA]: Secondary | ICD-10-CM | POA: Diagnosis not present

## 2021-11-13 DIAGNOSIS — N401 Enlarged prostate with lower urinary tract symptoms: Secondary | ICD-10-CM | POA: Diagnosis not present

## 2021-11-13 DIAGNOSIS — R350 Frequency of micturition: Secondary | ICD-10-CM | POA: Diagnosis not present

## 2021-11-27 DIAGNOSIS — I10 Essential (primary) hypertension: Secondary | ICD-10-CM | POA: Diagnosis not present

## 2021-11-27 DIAGNOSIS — K219 Gastro-esophageal reflux disease without esophagitis: Secondary | ICD-10-CM | POA: Diagnosis not present

## 2021-11-27 DIAGNOSIS — E785 Hyperlipidemia, unspecified: Secondary | ICD-10-CM | POA: Diagnosis not present

## 2021-11-27 DIAGNOSIS — E1142 Type 2 diabetes mellitus with diabetic polyneuropathy: Secondary | ICD-10-CM | POA: Diagnosis not present

## 2021-11-30 DIAGNOSIS — I10 Essential (primary) hypertension: Secondary | ICD-10-CM | POA: Diagnosis not present

## 2021-11-30 DIAGNOSIS — E782 Mixed hyperlipidemia: Secondary | ICD-10-CM | POA: Diagnosis not present

## 2021-11-30 DIAGNOSIS — M109 Gout, unspecified: Secondary | ICD-10-CM | POA: Diagnosis not present

## 2021-11-30 DIAGNOSIS — E1129 Type 2 diabetes mellitus with other diabetic kidney complication: Secondary | ICD-10-CM | POA: Diagnosis not present

## 2021-11-30 DIAGNOSIS — Z6828 Body mass index (BMI) 28.0-28.9, adult: Secondary | ICD-10-CM | POA: Diagnosis not present

## 2021-12-08 DIAGNOSIS — Z9889 Other specified postprocedural states: Secondary | ICD-10-CM | POA: Diagnosis not present

## 2021-12-08 DIAGNOSIS — J339 Nasal polyp, unspecified: Secondary | ICD-10-CM | POA: Diagnosis not present

## 2021-12-08 DIAGNOSIS — J328 Other chronic sinusitis: Secondary | ICD-10-CM | POA: Diagnosis not present

## 2021-12-08 DIAGNOSIS — Z8709 Personal history of other diseases of the respiratory system: Secondary | ICD-10-CM | POA: Diagnosis not present

## 2021-12-08 DIAGNOSIS — J343 Hypertrophy of nasal turbinates: Secondary | ICD-10-CM | POA: Diagnosis not present

## 2021-12-08 DIAGNOSIS — J3489 Other specified disorders of nose and nasal sinuses: Secondary | ICD-10-CM | POA: Diagnosis not present

## 2021-12-08 DIAGNOSIS — J329 Chronic sinusitis, unspecified: Secondary | ICD-10-CM | POA: Diagnosis not present

## 2021-12-22 DIAGNOSIS — I10 Essential (primary) hypertension: Secondary | ICD-10-CM | POA: Diagnosis not present

## 2021-12-22 DIAGNOSIS — I872 Venous insufficiency (chronic) (peripheral): Secondary | ICD-10-CM | POA: Diagnosis not present

## 2021-12-22 DIAGNOSIS — Z79899 Other long term (current) drug therapy: Secondary | ICD-10-CM | POA: Diagnosis not present

## 2021-12-28 DIAGNOSIS — E1142 Type 2 diabetes mellitus with diabetic polyneuropathy: Secondary | ICD-10-CM | POA: Diagnosis not present

## 2021-12-28 DIAGNOSIS — E785 Hyperlipidemia, unspecified: Secondary | ICD-10-CM | POA: Diagnosis not present

## 2021-12-28 DIAGNOSIS — K219 Gastro-esophageal reflux disease without esophagitis: Secondary | ICD-10-CM | POA: Diagnosis not present

## 2021-12-28 DIAGNOSIS — I10 Essential (primary) hypertension: Secondary | ICD-10-CM | POA: Diagnosis not present

## 2022-01-15 DIAGNOSIS — M7582 Other shoulder lesions, left shoulder: Secondary | ICD-10-CM | POA: Diagnosis not present

## 2022-01-28 DIAGNOSIS — I10 Essential (primary) hypertension: Secondary | ICD-10-CM | POA: Diagnosis not present

## 2022-01-28 DIAGNOSIS — E1142 Type 2 diabetes mellitus with diabetic polyneuropathy: Secondary | ICD-10-CM | POA: Diagnosis not present

## 2022-02-05 DIAGNOSIS — R051 Acute cough: Secondary | ICD-10-CM | POA: Diagnosis not present

## 2022-02-17 DIAGNOSIS — E1129 Type 2 diabetes mellitus with other diabetic kidney complication: Secondary | ICD-10-CM | POA: Diagnosis not present

## 2022-02-24 DIAGNOSIS — I10 Essential (primary) hypertension: Secondary | ICD-10-CM | POA: Diagnosis not present

## 2022-02-24 DIAGNOSIS — E1142 Type 2 diabetes mellitus with diabetic polyneuropathy: Secondary | ICD-10-CM | POA: Diagnosis not present

## 2022-02-27 DIAGNOSIS — I10 Essential (primary) hypertension: Secondary | ICD-10-CM | POA: Diagnosis not present

## 2022-02-27 DIAGNOSIS — E1142 Type 2 diabetes mellitus with diabetic polyneuropathy: Secondary | ICD-10-CM | POA: Diagnosis not present

## 2022-03-12 DIAGNOSIS — I10 Essential (primary) hypertension: Secondary | ICD-10-CM | POA: Diagnosis not present

## 2022-03-12 DIAGNOSIS — E119 Type 2 diabetes mellitus without complications: Secondary | ICD-10-CM | POA: Diagnosis not present

## 2022-03-16 DIAGNOSIS — H26491 Other secondary cataract, right eye: Secondary | ICD-10-CM | POA: Diagnosis not present

## 2022-03-16 DIAGNOSIS — H26493 Other secondary cataract, bilateral: Secondary | ICD-10-CM | POA: Diagnosis not present

## 2022-03-17 DIAGNOSIS — E1129 Type 2 diabetes mellitus with other diabetic kidney complication: Secondary | ICD-10-CM | POA: Diagnosis not present

## 2022-03-24 DIAGNOSIS — I872 Venous insufficiency (chronic) (peripheral): Secondary | ICD-10-CM | POA: Diagnosis not present

## 2022-03-24 DIAGNOSIS — Z79899 Other long term (current) drug therapy: Secondary | ICD-10-CM | POA: Diagnosis not present

## 2022-03-24 DIAGNOSIS — I10 Essential (primary) hypertension: Secondary | ICD-10-CM | POA: Diagnosis not present

## 2022-03-24 DIAGNOSIS — I1A0 Resistant hypertension: Secondary | ICD-10-CM | POA: Diagnosis not present

## 2022-03-26 DIAGNOSIS — I10 Essential (primary) hypertension: Secondary | ICD-10-CM | POA: Diagnosis not present

## 2022-03-26 DIAGNOSIS — E119 Type 2 diabetes mellitus without complications: Secondary | ICD-10-CM | POA: Diagnosis not present

## 2022-03-30 DIAGNOSIS — E782 Mixed hyperlipidemia: Secondary | ICD-10-CM | POA: Diagnosis not present

## 2022-03-30 DIAGNOSIS — E119 Type 2 diabetes mellitus without complications: Secondary | ICD-10-CM | POA: Diagnosis not present

## 2022-03-30 DIAGNOSIS — I10 Essential (primary) hypertension: Secondary | ICD-10-CM | POA: Diagnosis not present

## 2022-04-29 DIAGNOSIS — I10 Essential (primary) hypertension: Secondary | ICD-10-CM | POA: Diagnosis not present

## 2022-04-29 DIAGNOSIS — E119 Type 2 diabetes mellitus without complications: Secondary | ICD-10-CM | POA: Diagnosis not present

## 2022-04-29 DIAGNOSIS — E782 Mixed hyperlipidemia: Secondary | ICD-10-CM | POA: Diagnosis not present

## 2022-05-14 DIAGNOSIS — R3914 Feeling of incomplete bladder emptying: Secondary | ICD-10-CM | POA: Diagnosis not present

## 2022-05-14 DIAGNOSIS — R350 Frequency of micturition: Secondary | ICD-10-CM | POA: Diagnosis not present

## 2022-05-14 DIAGNOSIS — N401 Enlarged prostate with lower urinary tract symptoms: Secondary | ICD-10-CM | POA: Diagnosis not present

## 2022-05-30 DIAGNOSIS — I10 Essential (primary) hypertension: Secondary | ICD-10-CM | POA: Diagnosis not present

## 2022-05-30 DIAGNOSIS — E119 Type 2 diabetes mellitus without complications: Secondary | ICD-10-CM | POA: Diagnosis not present

## 2022-06-08 DIAGNOSIS — Z9889 Other specified postprocedural states: Secondary | ICD-10-CM | POA: Diagnosis not present

## 2022-06-08 DIAGNOSIS — J329 Chronic sinusitis, unspecified: Secondary | ICD-10-CM | POA: Diagnosis not present

## 2022-06-08 DIAGNOSIS — J328 Other chronic sinusitis: Secondary | ICD-10-CM | POA: Diagnosis not present

## 2022-06-08 DIAGNOSIS — J339 Nasal polyp, unspecified: Secondary | ICD-10-CM | POA: Diagnosis not present

## 2022-06-08 DIAGNOSIS — J3489 Other specified disorders of nose and nasal sinuses: Secondary | ICD-10-CM | POA: Diagnosis not present

## 2022-06-30 DIAGNOSIS — E119 Type 2 diabetes mellitus without complications: Secondary | ICD-10-CM | POA: Diagnosis not present

## 2022-06-30 DIAGNOSIS — I10 Essential (primary) hypertension: Secondary | ICD-10-CM | POA: Diagnosis not present

## 2022-07-10 DIAGNOSIS — R0781 Pleurodynia: Secondary | ICD-10-CM | POA: Diagnosis not present

## 2022-07-10 DIAGNOSIS — W1830XA Fall on same level, unspecified, initial encounter: Secondary | ICD-10-CM | POA: Diagnosis not present

## 2022-07-10 DIAGNOSIS — I10 Essential (primary) hypertension: Secondary | ICD-10-CM | POA: Diagnosis not present

## 2022-07-10 DIAGNOSIS — I700 Atherosclerosis of aorta: Secondary | ICD-10-CM | POA: Diagnosis not present

## 2022-07-10 DIAGNOSIS — S20212A Contusion of left front wall of thorax, initial encounter: Secondary | ICD-10-CM | POA: Diagnosis not present

## 2022-07-29 DIAGNOSIS — E119 Type 2 diabetes mellitus without complications: Secondary | ICD-10-CM | POA: Diagnosis not present

## 2022-07-29 DIAGNOSIS — I10 Essential (primary) hypertension: Secondary | ICD-10-CM | POA: Diagnosis not present

## 2022-08-23 DIAGNOSIS — I10 Essential (primary) hypertension: Secondary | ICD-10-CM | POA: Diagnosis not present

## 2022-08-23 DIAGNOSIS — E119 Type 2 diabetes mellitus without complications: Secondary | ICD-10-CM | POA: Diagnosis not present

## 2022-08-29 DIAGNOSIS — E119 Type 2 diabetes mellitus without complications: Secondary | ICD-10-CM | POA: Diagnosis not present

## 2022-08-29 DIAGNOSIS — I10 Essential (primary) hypertension: Secondary | ICD-10-CM | POA: Diagnosis not present

## 2022-09-22 DIAGNOSIS — E119 Type 2 diabetes mellitus without complications: Secondary | ICD-10-CM | POA: Diagnosis not present

## 2022-09-22 DIAGNOSIS — I10 Essential (primary) hypertension: Secondary | ICD-10-CM | POA: Diagnosis not present

## 2022-09-24 DIAGNOSIS — L259 Unspecified contact dermatitis, unspecified cause: Secondary | ICD-10-CM | POA: Diagnosis not present

## 2022-09-24 DIAGNOSIS — B029 Zoster without complications: Secondary | ICD-10-CM | POA: Diagnosis not present

## 2022-09-28 DIAGNOSIS — I10 Essential (primary) hypertension: Secondary | ICD-10-CM | POA: Diagnosis not present

## 2022-09-28 DIAGNOSIS — E119 Type 2 diabetes mellitus without complications: Secondary | ICD-10-CM | POA: Diagnosis not present

## 2022-10-15 DIAGNOSIS — E782 Mixed hyperlipidemia: Secondary | ICD-10-CM | POA: Diagnosis not present

## 2022-10-15 DIAGNOSIS — Z0000 Encounter for general adult medical examination without abnormal findings: Secondary | ICD-10-CM | POA: Diagnosis not present

## 2022-10-15 DIAGNOSIS — Z1339 Encounter for screening examination for other mental health and behavioral disorders: Secondary | ICD-10-CM | POA: Diagnosis not present

## 2022-10-15 DIAGNOSIS — E1129 Type 2 diabetes mellitus with other diabetic kidney complication: Secondary | ICD-10-CM | POA: Diagnosis not present

## 2022-10-15 DIAGNOSIS — M109 Gout, unspecified: Secondary | ICD-10-CM | POA: Diagnosis not present

## 2022-10-15 DIAGNOSIS — I10 Essential (primary) hypertension: Secondary | ICD-10-CM | POA: Diagnosis not present

## 2022-10-15 DIAGNOSIS — K219 Gastro-esophageal reflux disease without esophagitis: Secondary | ICD-10-CM | POA: Diagnosis not present

## 2022-10-15 DIAGNOSIS — Z6828 Body mass index (BMI) 28.0-28.9, adult: Secondary | ICD-10-CM | POA: Diagnosis not present

## 2022-10-15 DIAGNOSIS — R972 Elevated prostate specific antigen [PSA]: Secondary | ICD-10-CM | POA: Diagnosis not present

## 2022-10-29 DIAGNOSIS — I10 Essential (primary) hypertension: Secondary | ICD-10-CM | POA: Diagnosis not present

## 2022-10-29 DIAGNOSIS — E119 Type 2 diabetes mellitus without complications: Secondary | ICD-10-CM | POA: Diagnosis not present

## 2022-11-15 IMAGING — MG DIGITAL DIAGNOSTIC BILAT W/ TOMO W/ CAD
8 of 14 series · 9 of 40 positions shown · non-contrast
Comparison: Previous exam(s).

CLINICAL DATA: The patient presents with a left palpable lump.

EXAM:
DIGITAL DIAGNOSTIC BILATERAL MAMMOGRAM WITH TOMOSYNTHESIS AND CAD
TECHNIQUE: Bilateral digital diagnostic mammography and breast tomosynthesis
was performed. The images were evaluated with computer-aided
detection.

[R MLO synth-2D]
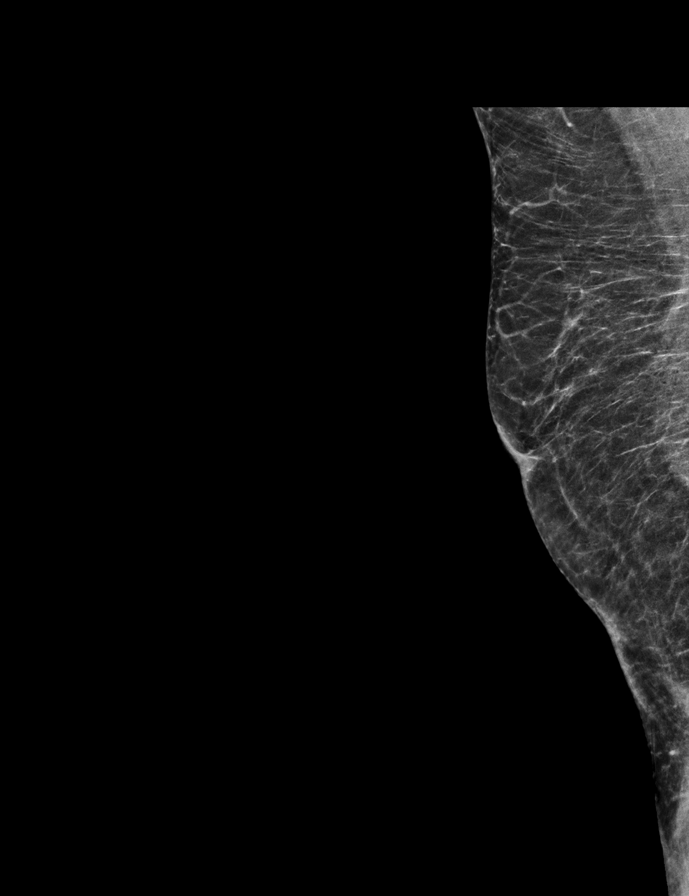

[L TAN synth-2D]
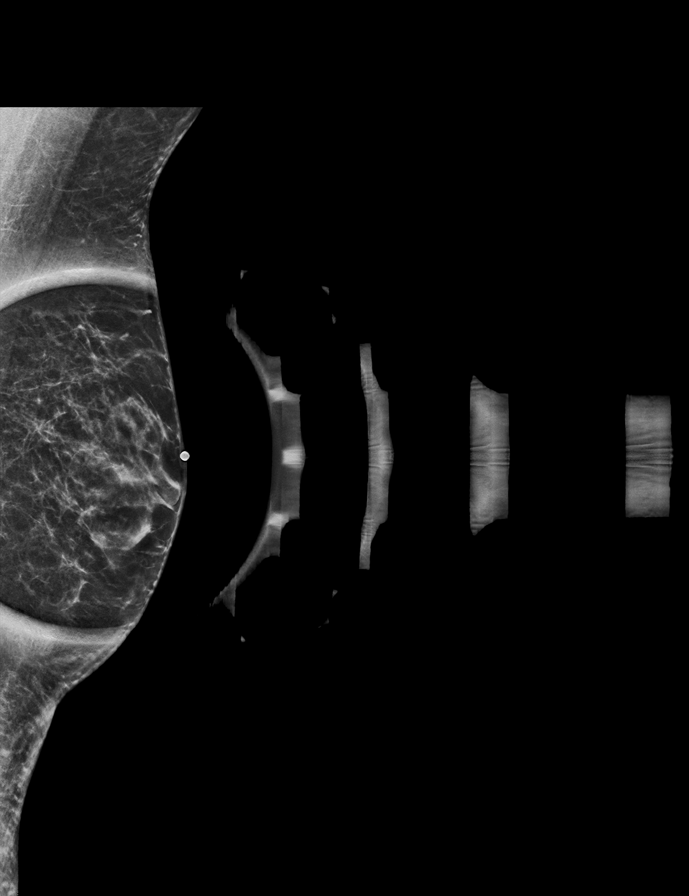

[R CC synth-2D]
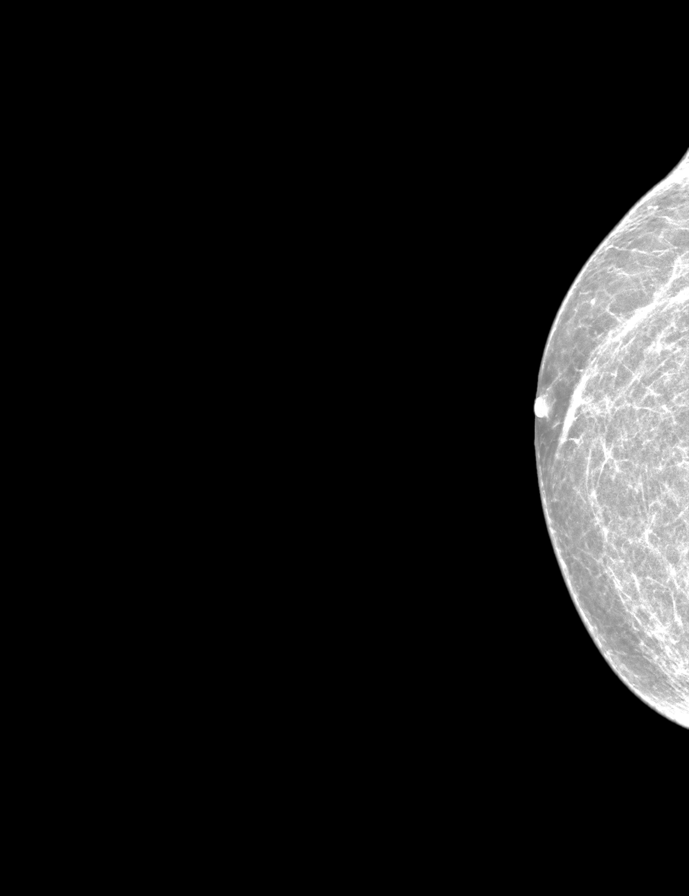

[L XCCL synth-2D]
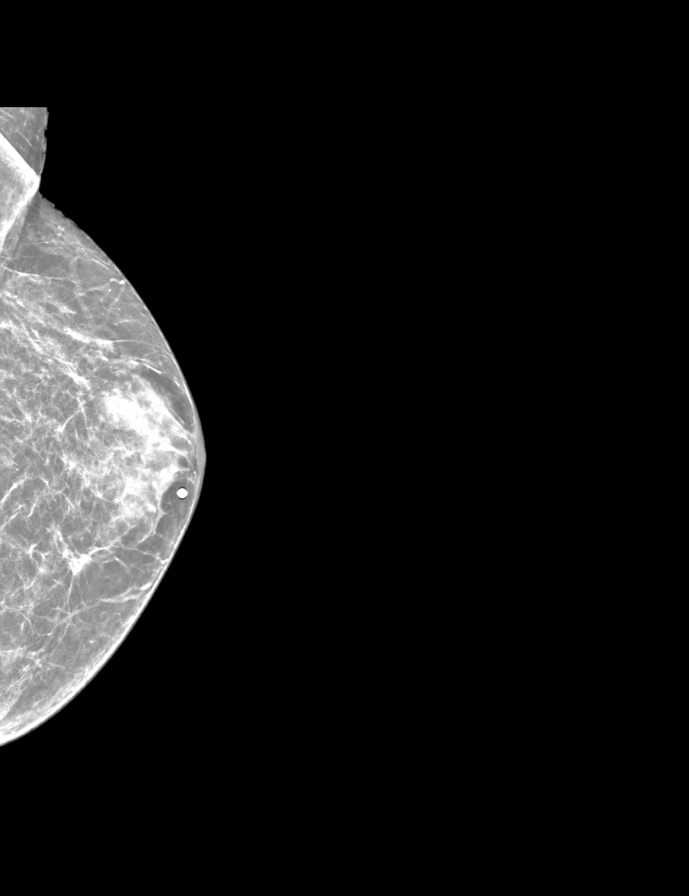

[L CC synth-2D (1 of 2)]
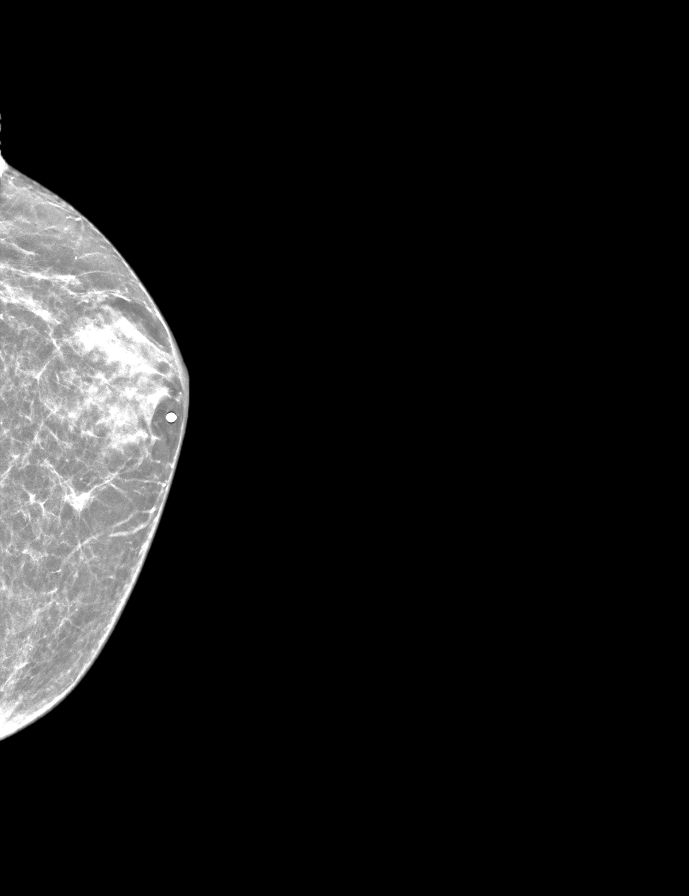

[L MLO synth-2D]
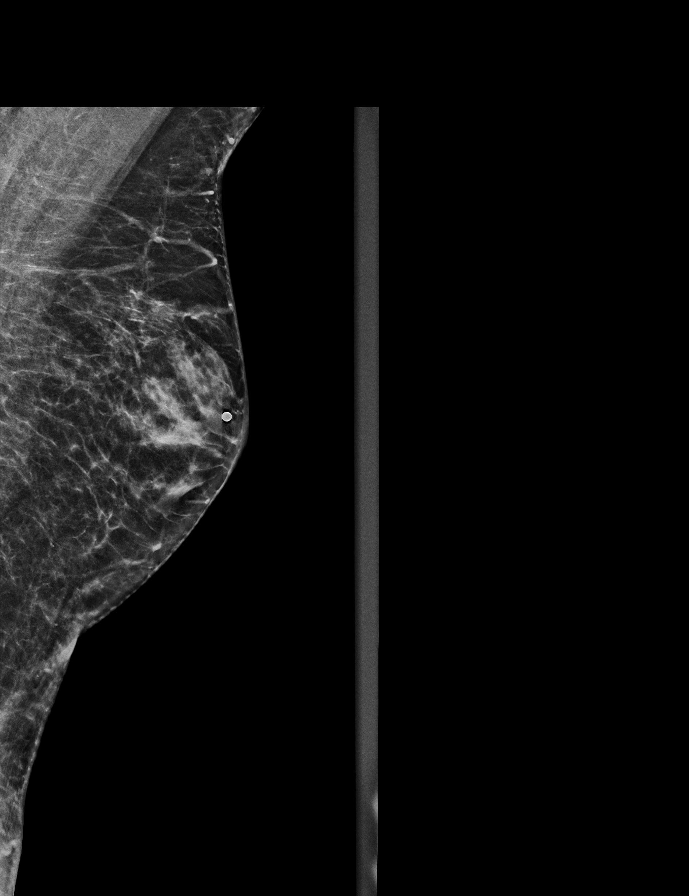

[L CC synth-2D (2 of 2)]
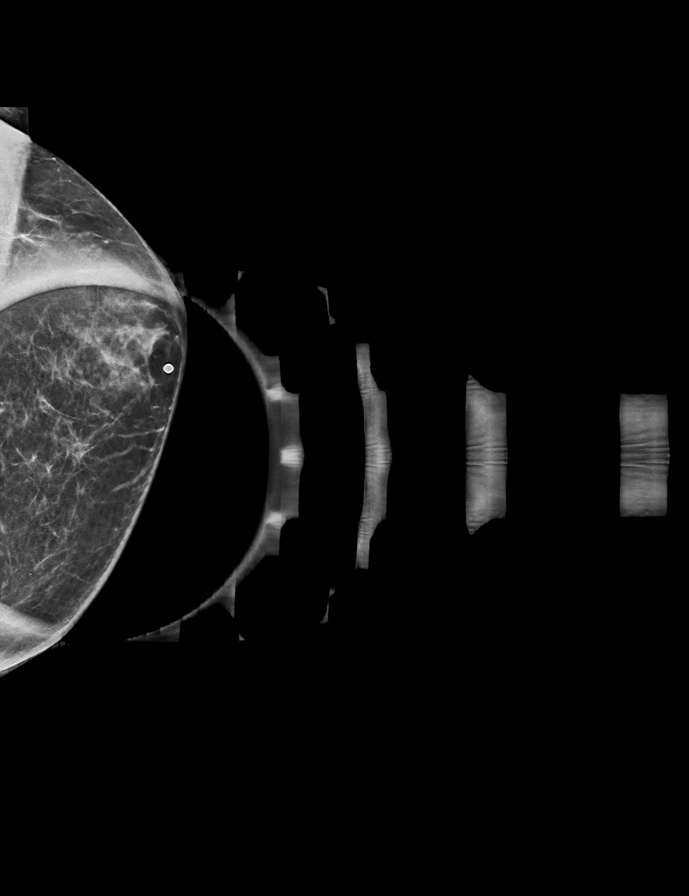

[L MLO tomo · 2 of 48 frames shown]
[frame 16/48]
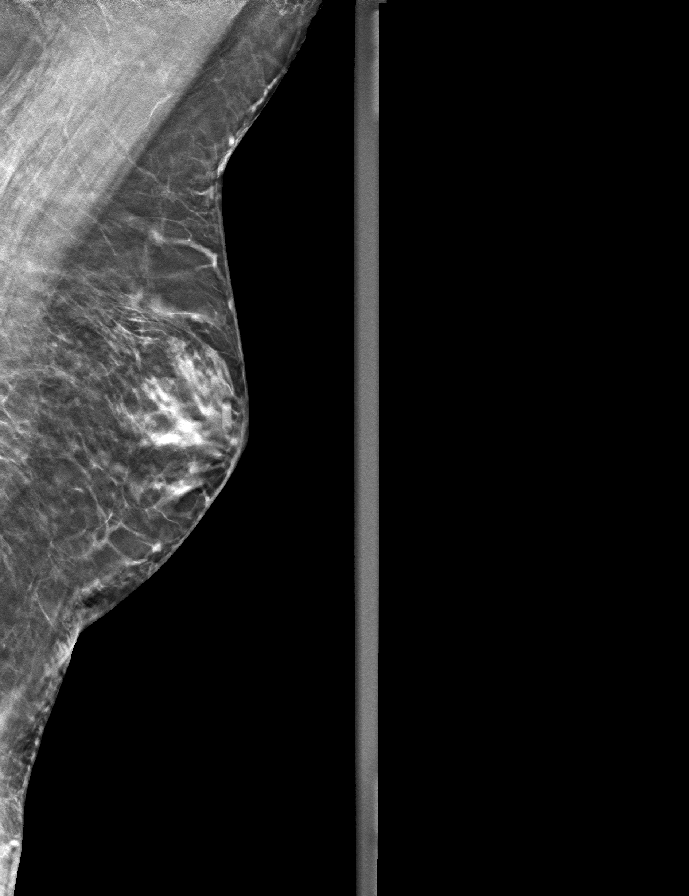
[frame 25/48]
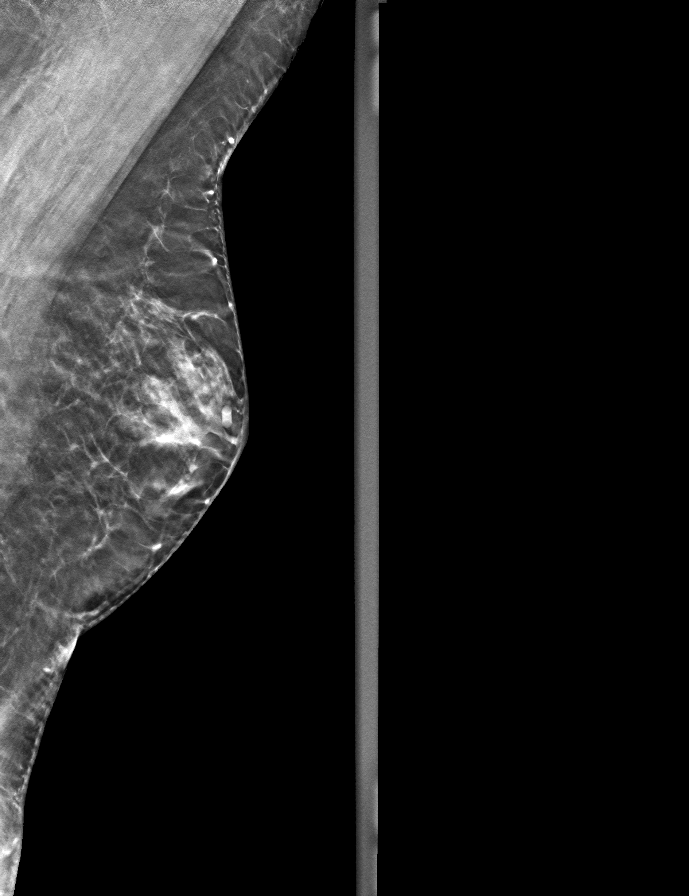

[9 of 40 positions shown; findings below may reference images not displayed]

ACR Breast Density Category b: There are scattered areas of
fibroglandular density.
FINDINGS: The right breast is normal in appearance.

The patient is palpating gynecomastia. More medially in the left
breast is an asymmetry which resolves into glandular tissue on spot
imaging. No suspicious findings on the left.
IMPRESSION: Left gynecomastia.  No evidence of malignancy in either breast.

RECOMMENDATION:
Treatment of the patient's gynecomastia should be based on clinical
and physical exam.

I have discussed the findings and recommendations with the patient.
If applicable, a reminder letter will be sent to the patient
regarding the next appointment.

BI-RADS CATEGORY  2: Benign.

## 2022-11-17 DIAGNOSIS — K5010 Crohn's disease of large intestine without complications: Secondary | ICD-10-CM | POA: Diagnosis not present

## 2022-11-17 DIAGNOSIS — K219 Gastro-esophageal reflux disease without esophagitis: Secondary | ICD-10-CM | POA: Diagnosis not present

## 2022-11-28 DIAGNOSIS — E119 Type 2 diabetes mellitus without complications: Secondary | ICD-10-CM | POA: Diagnosis not present

## 2022-11-28 DIAGNOSIS — I10 Essential (primary) hypertension: Secondary | ICD-10-CM | POA: Diagnosis not present

## 2022-12-15 DIAGNOSIS — E119 Type 2 diabetes mellitus without complications: Secondary | ICD-10-CM | POA: Diagnosis not present

## 2022-12-15 DIAGNOSIS — I10 Essential (primary) hypertension: Secondary | ICD-10-CM | POA: Diagnosis not present

## 2022-12-21 DIAGNOSIS — E119 Type 2 diabetes mellitus without complications: Secondary | ICD-10-CM | POA: Diagnosis not present

## 2022-12-21 DIAGNOSIS — I10 Essential (primary) hypertension: Secondary | ICD-10-CM | POA: Diagnosis not present

## 2022-12-29 DIAGNOSIS — E119 Type 2 diabetes mellitus without complications: Secondary | ICD-10-CM | POA: Diagnosis not present

## 2022-12-29 DIAGNOSIS — I10 Essential (primary) hypertension: Secondary | ICD-10-CM | POA: Diagnosis not present

## 2022-12-31 DIAGNOSIS — L989 Disorder of the skin and subcutaneous tissue, unspecified: Secondary | ICD-10-CM | POA: Diagnosis not present

## 2022-12-31 DIAGNOSIS — R202 Paresthesia of skin: Secondary | ICD-10-CM | POA: Diagnosis not present

## 2022-12-31 DIAGNOSIS — E1129 Type 2 diabetes mellitus with other diabetic kidney complication: Secondary | ICD-10-CM | POA: Diagnosis not present

## 2022-12-31 DIAGNOSIS — R200 Anesthesia of skin: Secondary | ICD-10-CM | POA: Diagnosis not present

## 2023-01-12 DIAGNOSIS — L700 Acne vulgaris: Secondary | ICD-10-CM | POA: Diagnosis not present

## 2023-01-12 DIAGNOSIS — L820 Inflamed seborrheic keratosis: Secondary | ICD-10-CM | POA: Diagnosis not present

## 2023-01-12 DIAGNOSIS — R0981 Nasal congestion: Secondary | ICD-10-CM | POA: Diagnosis not present

## 2023-01-12 DIAGNOSIS — R051 Acute cough: Secondary | ICD-10-CM | POA: Diagnosis not present

## 2023-01-20 DIAGNOSIS — R0600 Dyspnea, unspecified: Secondary | ICD-10-CM | POA: Diagnosis not present

## 2023-01-20 DIAGNOSIS — R0602 Shortness of breath: Secondary | ICD-10-CM | POA: Diagnosis not present

## 2023-01-20 DIAGNOSIS — Z7984 Long term (current) use of oral hypoglycemic drugs: Secondary | ICD-10-CM | POA: Diagnosis not present

## 2023-01-20 DIAGNOSIS — Z79899 Other long term (current) drug therapy: Secondary | ICD-10-CM | POA: Diagnosis not present

## 2023-01-20 DIAGNOSIS — E119 Type 2 diabetes mellitus without complications: Secondary | ICD-10-CM | POA: Diagnosis not present

## 2023-01-20 DIAGNOSIS — S2241XD Multiple fractures of ribs, right side, subsequent encounter for fracture with routine healing: Secondary | ICD-10-CM | POA: Diagnosis not present

## 2023-01-20 DIAGNOSIS — I10 Essential (primary) hypertension: Secondary | ICD-10-CM | POA: Diagnosis not present

## 2023-01-20 DIAGNOSIS — R519 Headache, unspecified: Secondary | ICD-10-CM | POA: Diagnosis not present

## 2023-01-20 DIAGNOSIS — J929 Pleural plaque without asbestos: Secondary | ICD-10-CM | POA: Diagnosis not present

## 2023-01-21 DIAGNOSIS — J929 Pleural plaque without asbestos: Secondary | ICD-10-CM | POA: Diagnosis not present

## 2023-01-21 DIAGNOSIS — I10 Essential (primary) hypertension: Secondary | ICD-10-CM | POA: Diagnosis not present

## 2023-01-21 DIAGNOSIS — S2241XD Multiple fractures of ribs, right side, subsequent encounter for fracture with routine healing: Secondary | ICD-10-CM | POA: Diagnosis not present

## 2023-01-21 DIAGNOSIS — R0600 Dyspnea, unspecified: Secondary | ICD-10-CM | POA: Diagnosis not present

## 2023-01-24 DIAGNOSIS — I872 Venous insufficiency (chronic) (peripheral): Secondary | ICD-10-CM | POA: Diagnosis not present

## 2023-01-24 DIAGNOSIS — I10 Essential (primary) hypertension: Secondary | ICD-10-CM | POA: Diagnosis not present

## 2023-01-29 DIAGNOSIS — I10 Essential (primary) hypertension: Secondary | ICD-10-CM | POA: Diagnosis not present

## 2023-01-29 DIAGNOSIS — E119 Type 2 diabetes mellitus without complications: Secondary | ICD-10-CM | POA: Diagnosis not present

## 2023-02-07 DIAGNOSIS — E1129 Type 2 diabetes mellitus with other diabetic kidney complication: Secondary | ICD-10-CM | POA: Diagnosis not present

## 2023-02-07 DIAGNOSIS — Z6827 Body mass index (BMI) 27.0-27.9, adult: Secondary | ICD-10-CM | POA: Diagnosis not present

## 2023-03-30 DIAGNOSIS — Z1211 Encounter for screening for malignant neoplasm of colon: Secondary | ICD-10-CM | POA: Diagnosis not present

## 2023-03-30 DIAGNOSIS — Z860109 Personal history of other colon polyps: Secondary | ICD-10-CM | POA: Diagnosis not present

## 2023-03-30 DIAGNOSIS — K5100 Ulcerative (chronic) pancolitis without complications: Secondary | ICD-10-CM | POA: Diagnosis not present

## 2023-04-08 DIAGNOSIS — M545 Low back pain, unspecified: Secondary | ICD-10-CM | POA: Diagnosis not present

## 2023-04-08 DIAGNOSIS — Z6826 Body mass index (BMI) 26.0-26.9, adult: Secondary | ICD-10-CM | POA: Diagnosis not present

## 2023-04-18 DIAGNOSIS — I10 Essential (primary) hypertension: Secondary | ICD-10-CM | POA: Diagnosis not present

## 2023-04-18 DIAGNOSIS — K5190 Ulcerative colitis, unspecified, without complications: Secondary | ICD-10-CM | POA: Diagnosis not present

## 2023-04-18 DIAGNOSIS — K5730 Diverticulosis of large intestine without perforation or abscess without bleeding: Secondary | ICD-10-CM | POA: Diagnosis not present

## 2023-04-18 DIAGNOSIS — Z860109 Personal history of other colon polyps: Secondary | ICD-10-CM | POA: Diagnosis not present

## 2023-04-18 DIAGNOSIS — Z09 Encounter for follow-up examination after completed treatment for conditions other than malignant neoplasm: Secondary | ICD-10-CM | POA: Diagnosis not present

## 2023-04-18 DIAGNOSIS — Z860100 Personal history of colon polyps, unspecified: Secondary | ICD-10-CM | POA: Diagnosis not present

## 2023-04-18 DIAGNOSIS — Z1211 Encounter for screening for malignant neoplasm of colon: Secondary | ICD-10-CM | POA: Diagnosis not present

## 2023-05-13 DIAGNOSIS — R3915 Urgency of urination: Secondary | ICD-10-CM | POA: Diagnosis not present

## 2023-05-13 DIAGNOSIS — N401 Enlarged prostate with lower urinary tract symptoms: Secondary | ICD-10-CM | POA: Diagnosis not present

## 2023-06-16 DIAGNOSIS — J3489 Other specified disorders of nose and nasal sinuses: Secondary | ICD-10-CM | POA: Diagnosis not present

## 2023-06-16 DIAGNOSIS — J328 Other chronic sinusitis: Secondary | ICD-10-CM | POA: Diagnosis not present

## 2023-06-21 DIAGNOSIS — R3914 Feeling of incomplete bladder emptying: Secondary | ICD-10-CM | POA: Diagnosis not present

## 2023-06-21 DIAGNOSIS — R3915 Urgency of urination: Secondary | ICD-10-CM | POA: Diagnosis not present

## 2023-06-21 DIAGNOSIS — R350 Frequency of micturition: Secondary | ICD-10-CM | POA: Diagnosis not present

## 2023-06-21 DIAGNOSIS — N401 Enlarged prostate with lower urinary tract symptoms: Secondary | ICD-10-CM | POA: Diagnosis not present

## 2023-06-21 DIAGNOSIS — R3912 Poor urinary stream: Secondary | ICD-10-CM | POA: Diagnosis not present

## 2023-06-24 DIAGNOSIS — I1A0 Resistant hypertension: Secondary | ICD-10-CM | POA: Diagnosis not present

## 2023-07-12 DIAGNOSIS — D485 Neoplasm of uncertain behavior of skin: Secondary | ICD-10-CM | POA: Diagnosis not present

## 2023-08-02 DIAGNOSIS — R3915 Urgency of urination: Secondary | ICD-10-CM | POA: Diagnosis not present

## 2023-08-02 DIAGNOSIS — R3912 Poor urinary stream: Secondary | ICD-10-CM | POA: Diagnosis not present

## 2023-08-03 DIAGNOSIS — C44311 Basal cell carcinoma of skin of nose: Secondary | ICD-10-CM | POA: Diagnosis not present

## 2023-08-05 DIAGNOSIS — R1084 Generalized abdominal pain: Secondary | ICD-10-CM | POA: Diagnosis not present

## 2023-08-05 DIAGNOSIS — K760 Fatty (change of) liver, not elsewhere classified: Secondary | ICD-10-CM | POA: Diagnosis not present

## 2023-08-05 DIAGNOSIS — K50918 Crohn's disease, unspecified, with other complication: Secondary | ICD-10-CM | POA: Diagnosis not present

## 2023-08-05 DIAGNOSIS — K859 Acute pancreatitis without necrosis or infection, unspecified: Secondary | ICD-10-CM | POA: Diagnosis not present

## 2023-08-05 DIAGNOSIS — R109 Unspecified abdominal pain: Secondary | ICD-10-CM | POA: Diagnosis not present

## 2023-08-05 DIAGNOSIS — N400 Enlarged prostate without lower urinary tract symptoms: Secondary | ICD-10-CM | POA: Diagnosis not present

## 2023-08-05 DIAGNOSIS — E7800 Pure hypercholesterolemia, unspecified: Secondary | ICD-10-CM | POA: Diagnosis not present

## 2023-08-05 DIAGNOSIS — K861 Other chronic pancreatitis: Secondary | ICD-10-CM | POA: Diagnosis not present

## 2023-08-05 DIAGNOSIS — R112 Nausea with vomiting, unspecified: Secondary | ICD-10-CM | POA: Diagnosis not present

## 2023-08-05 DIAGNOSIS — R7401 Elevation of levels of liver transaminase levels: Secondary | ICD-10-CM | POA: Diagnosis not present

## 2023-08-05 DIAGNOSIS — M109 Gout, unspecified: Secondary | ICD-10-CM | POA: Diagnosis not present

## 2023-08-05 DIAGNOSIS — I10 Essential (primary) hypertension: Secondary | ICD-10-CM | POA: Diagnosis not present

## 2023-08-12 DIAGNOSIS — K859 Acute pancreatitis without necrosis or infection, unspecified: Secondary | ICD-10-CM | POA: Diagnosis not present

## 2023-08-12 DIAGNOSIS — R7989 Other specified abnormal findings of blood chemistry: Secondary | ICD-10-CM | POA: Diagnosis not present

## 2023-08-12 DIAGNOSIS — Z6826 Body mass index (BMI) 26.0-26.9, adult: Secondary | ICD-10-CM | POA: Diagnosis not present

## 2023-09-08 ENCOUNTER — Other Ambulatory Visit: Payer: Self-pay | Admitting: Urology

## 2023-09-27 DIAGNOSIS — K859 Acute pancreatitis without necrosis or infection, unspecified: Secondary | ICD-10-CM | POA: Diagnosis not present

## 2023-09-27 DIAGNOSIS — Z6826 Body mass index (BMI) 26.0-26.9, adult: Secondary | ICD-10-CM | POA: Diagnosis not present

## 2023-09-27 DIAGNOSIS — R7401 Elevation of levels of liver transaminase levels: Secondary | ICD-10-CM | POA: Diagnosis not present

## 2023-09-27 DIAGNOSIS — E1129 Type 2 diabetes mellitus with other diabetic kidney complication: Secondary | ICD-10-CM | POA: Diagnosis not present

## 2023-10-05 DIAGNOSIS — Z8719 Personal history of other diseases of the digestive system: Secondary | ICD-10-CM | POA: Diagnosis not present

## 2023-10-05 DIAGNOSIS — K5090 Crohn's disease, unspecified, without complications: Secondary | ICD-10-CM | POA: Diagnosis not present

## 2023-10-05 DIAGNOSIS — K861 Other chronic pancreatitis: Secondary | ICD-10-CM | POA: Diagnosis not present

## 2023-10-05 DIAGNOSIS — R935 Abnormal findings on diagnostic imaging of other abdominal regions, including retroperitoneum: Secondary | ICD-10-CM | POA: Diagnosis not present

## 2023-10-05 DIAGNOSIS — K219 Gastro-esophageal reflux disease without esophagitis: Secondary | ICD-10-CM | POA: Diagnosis not present

## 2023-10-05 DIAGNOSIS — R748 Abnormal levels of other serum enzymes: Secondary | ICD-10-CM | POA: Diagnosis not present

## 2023-10-05 DIAGNOSIS — R7401 Elevation of levels of liver transaminase levels: Secondary | ICD-10-CM | POA: Diagnosis not present

## 2023-10-28 DIAGNOSIS — E1129 Type 2 diabetes mellitus with other diabetic kidney complication: Secondary | ICD-10-CM | POA: Diagnosis not present

## 2023-10-28 DIAGNOSIS — Z6826 Body mass index (BMI) 26.0-26.9, adult: Secondary | ICD-10-CM | POA: Diagnosis not present

## 2023-10-28 DIAGNOSIS — I10 Essential (primary) hypertension: Secondary | ICD-10-CM | POA: Diagnosis not present

## 2023-10-28 DIAGNOSIS — K859 Acute pancreatitis without necrosis or infection, unspecified: Secondary | ICD-10-CM | POA: Diagnosis not present

## 2023-11-03 DIAGNOSIS — E1129 Type 2 diabetes mellitus with other diabetic kidney complication: Secondary | ICD-10-CM | POA: Diagnosis not present

## 2023-11-04 ENCOUNTER — Encounter (HOSPITAL_COMMUNITY)

## 2023-12-23 DIAGNOSIS — R748 Abnormal levels of other serum enzymes: Secondary | ICD-10-CM | POA: Diagnosis not present

## 2024-01-09 DIAGNOSIS — K219 Gastro-esophageal reflux disease without esophagitis: Secondary | ICD-10-CM | POA: Diagnosis not present

## 2024-01-25 DIAGNOSIS — E119 Type 2 diabetes mellitus without complications: Secondary | ICD-10-CM | POA: Diagnosis not present

## 2024-01-25 DIAGNOSIS — H5231 Anisometropia: Secondary | ICD-10-CM | POA: Diagnosis not present

## 2024-01-25 DIAGNOSIS — Z961 Presence of intraocular lens: Secondary | ICD-10-CM | POA: Diagnosis not present

## 2024-01-25 DIAGNOSIS — Z794 Long term (current) use of insulin: Secondary | ICD-10-CM | POA: Diagnosis not present

## 2024-01-25 DIAGNOSIS — H52221 Regular astigmatism, right eye: Secondary | ICD-10-CM | POA: Diagnosis not present

## 2024-01-25 DIAGNOSIS — Z7984 Long term (current) use of oral hypoglycemic drugs: Secondary | ICD-10-CM | POA: Diagnosis not present

## 2024-01-25 DIAGNOSIS — Z9841 Cataract extraction status, right eye: Secondary | ICD-10-CM | POA: Diagnosis not present

## 2024-01-25 DIAGNOSIS — Z9842 Cataract extraction status, left eye: Secondary | ICD-10-CM | POA: Diagnosis not present

## 2024-01-25 DIAGNOSIS — H524 Presbyopia: Secondary | ICD-10-CM | POA: Diagnosis not present

## 2024-01-27 DIAGNOSIS — G8929 Other chronic pain: Secondary | ICD-10-CM | POA: Diagnosis not present

## 2024-01-27 DIAGNOSIS — M25512 Pain in left shoulder: Secondary | ICD-10-CM | POA: Diagnosis not present

## 2024-02-01 DIAGNOSIS — E1129 Type 2 diabetes mellitus with other diabetic kidney complication: Secondary | ICD-10-CM | POA: Diagnosis not present

## 2024-02-01 DIAGNOSIS — E782 Mixed hyperlipidemia: Secondary | ICD-10-CM | POA: Diagnosis not present

## 2024-02-02 DIAGNOSIS — I1A0 Resistant hypertension: Secondary | ICD-10-CM | POA: Diagnosis not present

## 2024-02-02 DIAGNOSIS — I872 Venous insufficiency (chronic) (peripheral): Secondary | ICD-10-CM | POA: Diagnosis not present

## 2024-02-02 DIAGNOSIS — I10 Essential (primary) hypertension: Secondary | ICD-10-CM | POA: Diagnosis not present

## 2024-02-06 DIAGNOSIS — Z1331 Encounter for screening for depression: Secondary | ICD-10-CM | POA: Diagnosis not present

## 2024-02-06 DIAGNOSIS — Z0000 Encounter for general adult medical examination without abnormal findings: Secondary | ICD-10-CM | POA: Diagnosis not present

## 2024-02-06 DIAGNOSIS — Z1339 Encounter for screening examination for other mental health and behavioral disorders: Secondary | ICD-10-CM | POA: Diagnosis not present

## 2024-02-06 DIAGNOSIS — E1129 Type 2 diabetes mellitus with other diabetic kidney complication: Secondary | ICD-10-CM | POA: Diagnosis not present

## 2024-02-07 DIAGNOSIS — R3915 Urgency of urination: Secondary | ICD-10-CM | POA: Diagnosis not present

## 2024-02-07 DIAGNOSIS — R3912 Poor urinary stream: Secondary | ICD-10-CM | POA: Diagnosis not present

## 2024-02-14 NOTE — Progress Notes (Incomplete Revision)
 COVID Vaccine received:  []  No []  Yes Date of any COVID positive Test in last 90 days:  PCP - Marcellus Baptist, MD  Vascular-  Johanna Lipps, MD at [facility]  Cardiologist -   Chest x-ray - 07-10-22  3v  CEW EKG -  do at PST Stress Test -  ECHO -  Cardiac Cath -  CT Coronary Calcium score:   Bowel Prep - []  No  [x]   Yes __mag citrate____  Pacemaker / ICD device [x]  No []  Yes   Spinal Cord Stimulator:[x]  No []  Yes       History of Sleep Apnea? []  No [x]  Yes   CPAP used?- []  No []  Yes    Patient has: []  NO Hx DM   []  Pre-DM   []  DM1  [x]   DM2 Does the patient monitor blood sugar?   []  N/A   []  No []  Yes  Last A1c was:        on      Does patient have a [employer] or Dexcom? []  No []  Yes   Fasting Blood Sugar Ranges-  Checks Blood Sugar _____ times a day  TOUJEO- 15 units daily,  JARDIANCE-  Hold x 72 hours GLIPIZIDE- Hold DOS 50% dose  Blood Thinner / Instructions:  none Aspirin Instructions:  none  ERAS Protocol Ordered: [x]  No  []  Yes Patient is to be NPO after: MN Prior  Dental hx: []  Dentures:  []  N/A      []  Bridge or Partial:                   []  Loose or Damaged teeth:   Comments:   Activity level: Able to walk up 2 flights of stairs without becoming significantly short of breath or having chest pain?  []  No   []    Yes  Patient can perform ADLs without assistance. []  No   []   Yes  Anesthesia review: DM2, Cushing's syndrome, HTN, murmur, OSA-   , Hx TB at age 23   Patient denies any S&S of respiratory illness or Covid - no shortness of breath, fever, cough or chest pain at PAT appointment.  Patient verbalized understanding and agreement to the Pre-Surgical Instructions that were given to them at this PAT appointment. Patient was also educated of the need to review these PAT instructions again prior to his surgery.I reviewed the appropriate phone numbers to call if they have any and questions or concerns.

## 2024-02-14 NOTE — Patient Instructions (Addendum)
 SURGICAL WAITING ROOM VISITATION Patients having surgery or a procedure may have no more than 2 support people in the waiting area - these visitors may rotate in the visitor waiting room.   If the patient needs to stay at the hospital during part of their recovery, the visitor guidelines for inpatient rooms apply.  PRE-OP VISITATION  Pre-op nurse will coordinate an appropriate time for 1 support person to accompany the patient in pre-op.  This support person may not rotate.  This visitor will be contacted when the time is appropriate for the visitor to come back in the pre-op area.  Please refer to the [facility] website for the visitor guidelines for Inpatients (after your surgery is over and you are in a regular room).  You are not required to quarantine at this time prior to your surgery. However, you must do this: Hand Hygiene often Do NOT share personal items Notify your provider if you are in close contact with someone who has COVID or you develop fever 100.4 or greater, new onset of sneezing, cough, sore throat, shortness of breath or body aches.  If you test positive for Covid or have been in contact with anyone that has tested positive in the last 10 days please notify you surgeon.    Your procedure is scheduled on:  Friday  02-24-24  Report to [facility] Main Entrance: [address] entrance where the [employer] is available.   Report to admitting at: 05:15    AM  Call this number if you have any questions or problems the morning of surgery [phone]  DO NOT EAT OR DRINK ANYTHING AFTER MIDNIGHT THE NIGHT PRIOR TO YOUR SURGERY / PROCEDURE.   FOLLOW  ANY ADDITIONAL PRE OP INSTRUCTIONS YOU RECEIVED FROM YOUR SURGEON'S OFFICE!!!  MAGNESIUM CITRATE:  Obtain one (1)  bottle (8 oz) of Magnesium Citrate at your pharmacy. Drink entire bottle at 12:00 noon the day before your surgery/ procedure.     Oral Hygiene is also important to reduce your risk of infection.         Remember - BRUSH YOUR TEETH THE MORNING OF SURGERY WITH YOUR REGULAR TOOTHPASTE  Do NOT smoke after Midnight the night before surgery.  How to Manage Your Diabetes Before and After Surgery  Why is it important to control my blood sugar before and after surgery? Improving blood sugar levels before and after surgery helps healing and can limit problems. A way of improving blood sugar control is eating a healthy diet by:  Eating less sugar and carbohydrates  Increasing activity/exercise  Talking with your doctor about reaching your blood sugar goals High blood sugars (greater than 180 mg/dL) can raise your risk of infections and slow your recovery, so you will need to focus on controlling your diabetes during the weeks before surgery. Make sure that the doctor who takes care of your diabetes knows about your planned surgery including the date and location.  How do I manage my blood sugar before surgery? Check your blood sugar at least 4 times a day, starting 2 days before surgery, to make sure that the level is not too high or low. Check your blood sugar the morning of your surgery when you wake up and every 2 hours until you get to the Short Stay unit. If your blood sugar is less than 70 mg/dL, you will need to treat for low blood sugar: Do not take insulin. Treat a low blood sugar (less than 70 mg/dL) with  cup  of clear juice (cranberry or apple), 4 glucose tablets, OR glucose gel. Recheck blood sugar in 15 minutes after treatment (to make sure it is greater than 70 mg/dL). If your blood sugar is not greater than 70 mg/dL on recheck, call [phone] for further instructions. Report your blood sugar to the short stay nurse when you get to Short Stay.  If you are admitted to the hospital after surgery: Your blood sugar will be checked by the staff and you will probably be given insulin after surgery (instead of oral diabetes medicines) to make sure you have good blood sugar levels. The  goal for blood sugar control after surgery is 80-180 mg/dL.   WHAT DO I DO ABOUT MY DIABETES MEDICATION?  TOUJEO- 15 units daily,     DAY BEFORE SURGERY: take 15 units as usual , DAY OF SURGERY:  take 50% or 7 units.  JARDIANCE- stop take for 72 hours before surgery.  Last dose will be taken on Monday 02-20-2024 GLIPIZIDE-  DAY BEFORE SURGERY; take as usual in the morning or lunch, DAY OF SURGERY: DO NOT TAKE GLIPIZIDE    IF you have any questions, call the nurse at [phone]  STOP TAKING all Vitamins, Herbs and supplements 1 week before your surgery.   Take ONLY these medicines the morning of surgery with A SIP OF WATER: Pantoprazole, finasteride silodosin, febuxostat, amlodipine, carvedilol, hydralazine . You may use your Flonase nasal spray if needed.   DO NOT TAKE Telmisartan or Spironolactone the morning of your surgery.   If You have been diagnosed with Sleep Apnea - Bring CPAP mask and tubing day of surgery. We will provide you with a CPAP machine on the day of your surgery.                   You may not have any metal on your body including jewelry, and body piercing  Do not wear  lotions, powders, cologne, or deodorant  Men may shave face and neck.  Contacts, Hearing Aids, dentures or bridgework may not be worn into surgery. DENTURES WILL BE REMOVED PRIOR TO SURGERY PLEASE DO NOT APPLY Poly grip OR ADHESIVES!!!  You may bring a small overnight bag with you on the day of surgery, only pack items that are not valuable. [facility] IS NOT RESPONSIBLE   FOR VALUABLES THAT ARE LOST OR STOLEN.   Do not bring your home medications to the hospital. The Pharmacy will dispense medications listed on your medication list to you during your admission in the Hospital.  Special Instructions: Bring a copy of your healthcare power of attorney and living will documents the day of surgery, if you wish to have them scanned into your [facility] Records- EPIC  Please read over the  following fact sheets you were given: IF YOU HAVE QUESTIONS ABOUT YOUR PRE-OP INSTRUCTIONS, PLEASE CALL [phone].   [place] - Preparing for Surgery Before surgery, you can play an important role.  Because skin is not sterile, your skin needs to be as free of germs as possible.  You can reduce the number of germs on your skin by washing with CHG (chlorahexidine gluconate) soap before surgery.  CHG is an antiseptic cleaner which kills germs and bonds with the skin to continue killing germs even after washing. Please DO NOT use if you have an allergy to CHG or antibacterial soaps.  If your skin becomes reddened/irritated stop using the CHG and inform your nurse when you arrive at Short Stay. Do  not shave (including legs and underarms) for at least 48 hours prior to the first CHG shower.  You may shave your face/neck.  Please follow these instructions carefully:  1.  Shower with CHG Soap the night before surgery and the  morning of surgery.  2.  If you choose to wash your hair, wash your hair first as usual with your normal  shampoo.  3.  After you shampoo, rinse your hair and body thoroughly to remove the shampoo.                             4.  Use CHG as you would any other liquid soap.  You can apply chg directly to the skin and wash.  Gently with a scrungie or clean washcloth.  5.  Apply the CHG Soap to your body ONLY FROM THE NECK DOWN.   Do not use on face/ open                           Wound or open sores. Avoid contact with eyes, ears mouth and genitals (private parts).                       Wash face,  Genitals (private parts) with your normal soap.             6.  Wash thoroughly, paying special attention to the area where your  surgery  will be performed.  7.  Thoroughly rinse your body with warm water from the neck down.  8.  DO NOT shower/wash with your normal soap after using and rinsing off the CHG Soap.            9.  Pat yourself dry with a clean towel.            10.  Wear  clean pajamas.            11.  Place clean sheets on your bed the night of your first shower and do not  sleep with pets.  ON THE DAY OF SURGERY : Do not apply any lotions/deodorants the morning of surgery.  Please wear clean clothes to the hospital/surgery center.    FAILURE TO FOLLOW THESE INSTRUCTIONS MAY RESULT IN THE CANCELLATION OF YOUR SURGERY  PATIENT SIGNATURE_________________________________  NURSE SIGNATURE__________________________________  ________________________________________________________________________

## 2024-02-14 NOTE — Progress Notes (Signed)
 COVID Vaccine received:  []  No [x]  Yes Date of any COVID positive Test in last 90 days: None  PCP - Marcellus Baptist, MD  Vascular / Cardiologist -- Johanna Lipps, MD at [facility]    Chest x-ray - 07-10-22  3v  CEW EKG -  Stress Test -  ECHO -  Cardiac Cath -  CT Coronary Calcium score:   Bowel Prep - []  No  [x]   Yes __mag citrate____  Pacemaker / ICD device [x]  No []  Yes   Spinal Cord Stimulator:[x]  No []  Yes       History of Sleep Apnea? []  No [x]  Yes   CPAP used?- []  No [x]  Yes    Patient has: []  NO Hx DM   []  Pre-DM   []  DM1  [x]   DM2 Does the patient monitor blood sugar?   []  N/A   []  No []  Yes  Last A1c was:        on      Does patient have a [employer] or Dexcom? []  No [x]  Yes   Fasting Blood Sugar Ranges-150-200  Checks Blood Sugar continuous times a day  TOUJEO- 15 units daily,  JARDIANCE-  Hold x 72 hours GLIPIZIDE- Hold DOS 50% dose  Blood Thinner / Instructions:  none Aspirin Instructions:  none  ERAS Protocol Ordered: [x]  No  []  Yes Patient is to be NPO after: MN Prior  Dental hx: []  Dentures:  [x]  N/A      []  Bridge or Partial:                   []  Loose or Damaged teeth:   Activity level: Able to walk up 2 flights of stairs without becoming significantly short of breath or having chest pain?  []  No   [x]    Yes  Patient can perform ADLs without assistance. []  No   [x]   Yes  Anesthesia review: DM2, Cushing's syndrome, HTN, murmur, OSA- CPAP , Hx TB at age 78 (tx with sulfa drugs per patient)  Patient denies any S&S of respiratory illness or Covid - no shortness of breath, fever, cough or chest pain at PAT appointment.  Patient verbalized understanding and agreement to the Pre-Surgical Instructions that were given to them at this PAT appointment. Patient was also educated of the need to review these PAT instructions again prior to his surgery.I reviewed the appropriate phone numbers to call if they have any and questions or concerns.

## 2024-02-15 ENCOUNTER — Other Ambulatory Visit: Payer: Self-pay

## 2024-02-15 ENCOUNTER — Encounter (HOSPITAL_COMMUNITY)
Admission: RE | Admit: 2024-02-15 | Discharge: 2024-02-15 | Disposition: A | Discharge status: Home / Self Care | Source: Ambulatory Visit | Attending: Urology | Admitting: Urology

## 2024-02-15 ENCOUNTER — Encounter (HOSPITAL_COMMUNITY): Payer: Self-pay

## 2024-02-15 VITALS — BP 156/70 | HR 58 | Temp 98.8°F | Resp 16 | Ht 67.0 in | Wt 172.0 lb

## 2024-02-15 DIAGNOSIS — Z01818 Encounter for other preprocedural examination: Secondary | ICD-10-CM | POA: Insufficient documentation

## 2024-02-15 DIAGNOSIS — K8689 Other specified diseases of pancreas: Secondary | ICD-10-CM | POA: Diagnosis not present

## 2024-02-15 DIAGNOSIS — I10 Essential (primary) hypertension: Secondary | ICD-10-CM | POA: Diagnosis not present

## 2024-02-15 DIAGNOSIS — E119 Type 2 diabetes mellitus without complications: Secondary | ICD-10-CM | POA: Insufficient documentation

## 2024-02-15 DIAGNOSIS — Z794 Long term (current) use of insulin: Secondary | ICD-10-CM | POA: Insufficient documentation

## 2024-02-15 DIAGNOSIS — R011 Cardiac murmur, unspecified: Secondary | ICD-10-CM | POA: Diagnosis not present

## 2024-02-15 HISTORY — DX: Respiratory tuberculosis unspecified: A15.9

## 2024-02-15 HISTORY — DX: Malignant (primary) neoplasm, unspecified: C80.1

## 2024-02-15 HISTORY — DX: Personal history of urinary calculi: Z87.442

## 2024-02-15 HISTORY — DX: Cardiac murmur, unspecified: R01.1

## 2024-02-15 HISTORY — DX: Essential (primary) hypertension: I10

## 2024-02-15 HISTORY — DX: Cushing's syndrome, unspecified: E24.9

## 2024-02-15 HISTORY — DX: Fatty (change of) liver, not elsewhere classified: K76.0

## 2024-02-15 HISTORY — DX: Type 2 diabetes mellitus without complications: E11.9

## 2024-02-15 HISTORY — DX: Gastro-esophageal reflux disease without esophagitis: K21.9

## 2024-02-15 HISTORY — DX: Unspecified osteoarthritis, unspecified site: M19.90

## 2024-02-15 HISTORY — DX: Sleep apnea, unspecified: G47.30

## 2024-02-15 LAB — COMPREHENSIVE METABOLIC PANEL WITH GFR
ALT: 54 U/L — ABNORMAL HIGH (ref 0–44)
AST: 37 U/L (ref 15–41)
Albumin: 4.4 g/dL (ref 3.5–5.0)
Alkaline Phosphatase: 85 U/L (ref 38–126)
Anion gap: 14 (ref 5–15)
BUN: 20 mg/dL (ref 8–23)
CO2: 20 mmol/L — ABNORMAL LOW (ref 22–32)
Calcium: 9.3 mg/dL (ref 8.9–10.3)
Chloride: 104 mmol/L (ref 98–111)
Creatinine, Ser: 1.09 mg/dL (ref 0.61–1.24)
GFR, Estimated: >60 mL/min (ref >60)
Glucose, Bld: 193 mg/dL — ABNORMAL HIGH (ref 70–99)
Potassium: 4.1 mmol/L (ref 3.5–5.1)
Sodium: 138 mmol/L (ref 135–145)
Total Bilirubin: 0.8 mg/dL (ref 0.0–1.2)
Total Protein: 7 g/dL (ref 6.5–8.1)

## 2024-02-15 LAB — GLUCOSE, CAPILLARY: Glucose-Capillary: 197 mg/dL — ABNORMAL HIGH (ref 70–99)

## 2024-02-15 LAB — CBC
HCT: 45.6 % (ref 39.0–52.0)
Hemoglobin: 15.3 g/dL (ref 13.0–17.0)
MCH: 30.6 pg (ref 26.0–34.0)
MCHC: 33.6 g/dL (ref 30.0–36.0)
MCV: 91.2 fL (ref 80.0–100.0)
Platelets: 173 K/uL (ref 150–400)
RBC: 5 MIL/uL (ref 4.22–5.81)
RDW: 14.6 % (ref 11.5–15.5)
WBC: 6.9 K/uL (ref 4.0–10.5)
nRBC: 0 % (ref 0.0–0.2)

## 2024-02-15 LAB — TYPE AND SCREEN
ABO/RH(D): B POS
Antibody Screen: NEGATIVE

## 2024-02-15 LAB — HEMOGLOBIN A1C
Hgb A1c MFr Bld: 7 % — ABNORMAL HIGH (ref 4.8–5.6)
Mean Plasma Glucose: 154.2 mg/dL

## 2024-02-24 ENCOUNTER — Other Ambulatory Visit: Payer: Self-pay

## 2024-02-24 ENCOUNTER — Ambulatory Visit (HOSPITAL_COMMUNITY): Payer: Self-pay | Admitting: Medical

## 2024-02-24 ENCOUNTER — Encounter (HOSPITAL_COMMUNITY): Payer: Self-pay | Admitting: Urology

## 2024-02-24 ENCOUNTER — Observation Stay (HOSPITAL_COMMUNITY)
Admission: RE | Admit: 2024-02-24 | Discharge: 2024-02-25 | Disposition: A | Discharge status: Home / Self Care | Attending: Urology | Admitting: Urology

## 2024-02-24 ENCOUNTER — Encounter (HOSPITAL_COMMUNITY)
Admission: RE | Disposition: A | Discharge status: Home / Self Care | Payer: Self-pay | Source: Home / Self Care | Attending: Urology

## 2024-02-24 ENCOUNTER — Ambulatory Visit (HOSPITAL_BASED_OUTPATIENT_CLINIC_OR_DEPARTMENT_OTHER): Payer: Self-pay

## 2024-02-24 DIAGNOSIS — N400 Enlarged prostate without lower urinary tract symptoms: Secondary | ICD-10-CM

## 2024-02-24 DIAGNOSIS — G4730 Sleep apnea, unspecified: Secondary | ICD-10-CM | POA: Diagnosis not present

## 2024-02-24 DIAGNOSIS — Z125 Encounter for screening for malignant neoplasm of prostate: Secondary | ICD-10-CM | POA: Insufficient documentation

## 2024-02-24 DIAGNOSIS — K5730 Diverticulosis of large intestine without perforation or abscess without bleeding: Secondary | ICD-10-CM | POA: Insufficient documentation

## 2024-02-24 DIAGNOSIS — I10 Essential (primary) hypertension: Secondary | ICD-10-CM | POA: Diagnosis not present

## 2024-02-24 DIAGNOSIS — E119 Type 2 diabetes mellitus without complications: Secondary | ICD-10-CM | POA: Insufficient documentation

## 2024-02-24 DIAGNOSIS — N138 Other obstructive and reflux uropathy: Principal | ICD-10-CM | POA: Insufficient documentation

## 2024-02-24 DIAGNOSIS — C61 Malignant neoplasm of prostate: Secondary | ICD-10-CM | POA: Diagnosis not present

## 2024-02-24 DIAGNOSIS — N401 Enlarged prostate with lower urinary tract symptoms: Secondary | ICD-10-CM | POA: Diagnosis not present

## 2024-02-24 DIAGNOSIS — Z01818 Encounter for other preprocedural examination: Secondary | ICD-10-CM

## 2024-02-24 DIAGNOSIS — Z79899 Other long term (current) drug therapy: Secondary | ICD-10-CM | POA: Insufficient documentation

## 2024-02-24 HISTORY — PX: XI ROBOTIC ASSISTED SIMPLE PROSTATECTOMY: SHX6713

## 2024-02-24 LAB — GLUCOSE, CAPILLARY
Glucose-Capillary: 186 mg/dL — ABNORMAL HIGH (ref 70–99)
Glucose-Capillary: 201 mg/dL — ABNORMAL HIGH (ref 70–99)
Glucose-Capillary: 183 mg/dL — ABNORMAL HIGH (ref 70–99)

## 2024-02-24 LAB — HEMOGLOBIN AND HEMATOCRIT, BLOOD
HCT: 44.3 % (ref 39.0–52.0)
Hemoglobin: 14.5 g/dL (ref 13.0–17.0)

## 2024-02-24 LAB — ABO/RH: ABO/RH(D): B POS

## 2024-02-24 SURGERY — PROSTATECTOMY, SIMPLE, ROBOT-ASSISTED
Anesthesia: General | Site: Pelvis

## 2024-02-24 MED ORDER — FENTANYL CITRATE PF 50 MCG/ML IJ SOSY
PREFILLED_SYRINGE | INTRAMUSCULAR | Status: AC
  Filled 2024-02-24: qty 1

## 2024-02-24 MED ORDER — INSULIN ASPART 100 UNIT/ML IJ SOLN
0.0000 [IU] | INTRAMUSCULAR | Status: DC | PRN
  Administered 2024-02-24: 2 [IU] via SUBCUTANEOUS
  Filled 2024-02-24: qty 1

## 2024-02-24 MED ORDER — DEXAMETHASONE SODIUM PHOSPHATE 10 MG/ML IJ SOLN
INTRAMUSCULAR | Status: DC | PRN
  Administered 2024-02-24: 8 mg via INTRAVENOUS

## 2024-02-24 MED ORDER — DIPHENHYDRAMINE HCL 12.5 MG/5ML PO ELIX: 12.5000 mg | ORAL_SOLUTION | Freq: Four times a day (QID) | ORAL | Status: DC | PRN

## 2024-02-24 MED ORDER — MAGNESIUM CITRATE PO SOLN: 1.0000 | Freq: Once | ORAL | Status: DC

## 2024-02-24 MED ORDER — ONDANSETRON HCL 4 MG/2ML IJ SOLN
INTRAMUSCULAR | Status: AC
  Filled 2024-02-24: qty 2

## 2024-02-24 MED ORDER — HYDROMORPHONE HCL 1 MG/ML IJ SOLN
0.5000 mg | INTRAMUSCULAR | Status: DC | PRN
  Administered 2024-02-24 (×2): 1 mg via INTRAVENOUS
  Filled 2024-02-24 (×2): qty 1

## 2024-02-24 MED ORDER — ACETAMINOPHEN 10 MG/ML IV SOLN
INTRAVENOUS | Status: AC
  Filled 2024-02-24: qty 100

## 2024-02-24 MED ORDER — FINASTERIDE 5 MG PO TABS
5.0000 mg | ORAL_TABLET | Freq: Every day | ORAL | Status: DC
  Administered 2024-02-24 – 2024-02-25 (×2): 5 mg via ORAL
  Filled 2024-02-24 (×2): qty 1

## 2024-02-24 MED ORDER — IRBESARTAN 300 MG PO TABS
300.0000 mg | ORAL_TABLET | Freq: Every day | ORAL | Status: DC
  Administered 2024-02-25: 300 mg via ORAL
  Filled 2024-02-24: qty 1

## 2024-02-24 MED ORDER — SULFAMETHOXAZOLE-TRIMETHOPRIM 800-160 MG PO TABS: 1.0000 | ORAL_TABLET | Freq: Two times a day (BID) | ORAL | 0 refills | Status: DC

## 2024-02-24 MED ORDER — DOCUSATE SODIUM 100 MG PO CAPS
100.0000 mg | ORAL_CAPSULE | Freq: Two times a day (BID) | ORAL | Status: DC
Start: 2024-02-24 — End: 2024-02-25
  Administered 2024-02-24 – 2024-02-25 (×2): 100 mg via ORAL
  Filled 2024-02-24 (×2): qty 1

## 2024-02-24 MED ORDER — SODIUM CHLORIDE (PF) 0.9 % IJ SOLN
INTRAMUSCULAR | Status: DC | PRN
  Administered 2024-02-24: 20 mL

## 2024-02-24 MED ORDER — CIPROFLOXACIN IN D5W 400 MG/200ML IV SOLN
400.0000 mg | INTRAVENOUS | Status: AC
  Administered 2024-02-24: 400 mg via INTRAVENOUS
  Filled 2024-02-24: qty 200

## 2024-02-24 MED ORDER — ROCURONIUM BROMIDE 100 MG/10ML IV SOLN
INTRAVENOUS | Status: DC | PRN
  Administered 2024-02-24: 20 mg via INTRAVENOUS
  Administered 2024-02-24: 5 mg via INTRAVENOUS
  Administered 2024-02-24: 60 mg via INTRAVENOUS

## 2024-02-24 MED ORDER — CLINDAMYCIN PHOSPHATE 600 MG/50ML IV SOLN
600.0000 mg | INTRAVENOUS | Status: AC
  Administered 2024-02-24: 600 mg via INTRAVENOUS
  Filled 2024-02-24: qty 50

## 2024-02-24 MED ORDER — SODIUM CHLORIDE (PF) 0.9 % IJ SOLN
INTRAMUSCULAR | Status: AC
  Filled 2024-02-24: qty 20

## 2024-02-24 MED ORDER — LIDOCAINE HCL (PF) 2 % IJ SOLN
INTRAMUSCULAR | Status: AC
  Filled 2024-02-24: qty 5

## 2024-02-24 MED ORDER — DEXAMETHASONE SODIUM PHOSPHATE 10 MG/ML IJ SOLN
INTRAMUSCULAR | Status: AC
  Filled 2024-02-24: qty 1

## 2024-02-24 MED ORDER — SODIUM CHLORIDE 0.9 % IV SOLN: INTRAVENOUS | Status: DC

## 2024-02-24 MED ORDER — HYOSCYAMINE SULFATE 0.125 MG SL SUBL: 0.1250 mg | SUBLINGUAL_TABLET | SUBLINGUAL | Status: DC | PRN

## 2024-02-24 MED ORDER — CHLORHEXIDINE GLUCONATE 0.12 % MT SOLN
15.0000 mL | Freq: Once | OROMUCOSAL | Status: AC
  Administered 2024-02-24: 15 mL via OROMUCOSAL

## 2024-02-24 MED ORDER — BUPIVACAINE LIPOSOME 1.3 % IJ SUSP
INTRAMUSCULAR | Status: DC | PRN
  Administered 2024-02-24: 20 mL

## 2024-02-24 MED ORDER — SUGAMMADEX SODIUM 200 MG/2ML IV SOLN
INTRAVENOUS | Status: AC
  Filled 2024-02-24: qty 2

## 2024-02-24 MED ORDER — ACETAMINOPHEN 500 MG PO TABS
1000.0000 mg | ORAL_TABLET | Freq: Four times a day (QID) | ORAL | Status: AC
  Administered 2024-02-24 – 2024-02-25 (×4): 1000 mg via ORAL
  Filled 2024-02-24 (×4): qty 2

## 2024-02-24 MED ORDER — ONDANSETRON HCL 4 MG/2ML IJ SOLN
4.0000 mg | INTRAMUSCULAR | Status: DC | PRN
  Administered 2024-02-24: 4 mg via INTRAVENOUS
  Filled 2024-02-24: qty 2

## 2024-02-24 MED ORDER — PHENYLEPHRINE 80 MCG/ML (10ML) SYRINGE FOR IV PUSH (FOR BLOOD PRESSURE SUPPORT)
PREFILLED_SYRINGE | INTRAVENOUS | Status: AC
Start: 2024-02-24 — End: 2024-02-24
  Filled 2024-02-24: qty 10

## 2024-02-24 MED ORDER — DIPHENHYDRAMINE HCL 50 MG/ML IJ SOLN: 12.5000 mg | Freq: Four times a day (QID) | INTRAMUSCULAR | Status: DC | PRN

## 2024-02-24 MED ORDER — SPIRONOLACTONE 25 MG PO TABS
25.0000 mg | ORAL_TABLET | Freq: Every day | ORAL | Status: DC
  Administered 2024-02-25: 25 mg via ORAL
  Filled 2024-02-24: qty 1

## 2024-02-24 MED ORDER — ACETAMINOPHEN 10 MG/ML IV SOLN
1000.0000 mg | Freq: Once | INTRAVENOUS | Status: DC | PRN
  Administered 2024-02-24: 1000 mg via INTRAVENOUS

## 2024-02-24 MED ORDER — OXYCODONE HCL 5 MG PO TABS
5.0000 mg | ORAL_TABLET | ORAL | Status: DC | PRN
  Administered 2024-02-25: 5 mg via ORAL
  Filled 2024-02-24: qty 1

## 2024-02-24 MED ORDER — LACTATED RINGERS IV SOLN
INTRAVENOUS | Status: DC
Start: 2024-02-24 — End: 2024-02-24

## 2024-02-24 MED ORDER — INSULIN ASPART 100 UNIT/ML IJ SOLN
INTRAMUSCULAR | Status: AC
  Filled 2024-02-24: qty 1

## 2024-02-24 MED ORDER — EPHEDRINE 5 MG/ML INJ
INTRAVENOUS | Status: AC
Start: 2024-02-24 — End: 2024-02-24
  Filled 2024-02-24: qty 5

## 2024-02-24 MED ORDER — HYDROCODONE-ACETAMINOPHEN 5-325 MG PO TABS: 1.0000 | ORAL_TABLET | Freq: Four times a day (QID) | ORAL | 0 refills | Status: DC | PRN

## 2024-02-24 MED ORDER — SUGAMMADEX SODIUM 200 MG/2ML IV SOLN
INTRAVENOUS | Status: DC | PRN
  Administered 2024-02-24: 200 mg via INTRAVENOUS

## 2024-02-24 MED ORDER — LIDOCAINE HCL (CARDIAC) PF 100 MG/5ML IV SOSY
PREFILLED_SYRINGE | INTRAVENOUS | Status: DC | PRN
  Administered 2024-02-24: 60 mg via INTRAVENOUS

## 2024-02-24 MED ORDER — TRIPLE ANTIBIOTIC 3.5-400-5000 EX OINT: 1.0000 | TOPICAL_OINTMENT | Freq: Three times a day (TID) | CUTANEOUS | Status: DC | PRN

## 2024-02-24 MED ORDER — FENTANYL CITRATE PF 50 MCG/ML IJ SOSY
PREFILLED_SYRINGE | INTRAMUSCULAR | Status: AC
  Filled 2024-02-24: qty 2

## 2024-02-24 MED ORDER — HYDRALAZINE HCL 25 MG PO TABS
25.0000 mg | ORAL_TABLET | Freq: Three times a day (TID) | ORAL | Status: DC
  Administered 2024-02-24 – 2024-02-25 (×3): 25 mg via ORAL
  Filled 2024-02-24 (×3): qty 1

## 2024-02-24 MED ORDER — INSULIN ASPART 100 UNIT/ML IJ SOLN
0.0000 [IU] | Freq: Three times a day (TID) | INTRAMUSCULAR | Status: DC
  Administered 2024-02-24: 3 [IU] via SUBCUTANEOUS
  Administered 2024-02-24: 5 [IU] via SUBCUTANEOUS
  Administered 2024-02-25: 2 [IU] via SUBCUTANEOUS
  Administered 2024-02-25: 8 [IU] via SUBCUTANEOUS

## 2024-02-24 MED ORDER — FENTANYL CITRATE (PF) 250 MCG/5ML IJ SOLN
INTRAMUSCULAR | Status: AC
  Filled 2024-02-24: qty 5

## 2024-02-24 MED ORDER — PANTOPRAZOLE SODIUM 40 MG PO TBEC: 40.0000 mg | DELAYED_RELEASE_TABLET | ORAL | Status: DC

## 2024-02-24 MED ORDER — EPHEDRINE SULFATE-NACL 50-0.9 MG/10ML-% IV SOSY
PREFILLED_SYRINGE | INTRAVENOUS | Status: DC | PRN
  Administered 2024-02-24 (×3): 5 mg via INTRAVENOUS

## 2024-02-24 MED ORDER — ONDANSETRON HCL 4 MG/2ML IJ SOLN
INTRAMUSCULAR | Status: DC | PRN
  Administered 2024-02-24: 4 mg via INTRAVENOUS

## 2024-02-24 MED ORDER — PROPOFOL 10 MG/ML IV BOLUS
INTRAVENOUS | Status: AC
  Filled 2024-02-24: qty 20

## 2024-02-24 MED ORDER — ORAL CARE MOUTH RINSE: 15.0000 mL | Freq: Once | OROMUCOSAL | Status: AC

## 2024-02-24 MED ORDER — AMLODIPINE BESYLATE 10 MG PO TABS
5.0000 mg | ORAL_TABLET | Freq: Every day | ORAL | Status: DC
  Administered 2024-02-25: 5 mg via ORAL
  Filled 2024-02-24: qty 1

## 2024-02-24 MED ORDER — ROCURONIUM BROMIDE 10 MG/ML (PF) SYRINGE
PREFILLED_SYRINGE | INTRAVENOUS | Status: AC
  Filled 2024-02-24: qty 10

## 2024-02-24 MED ORDER — PROPOFOL 10 MG/ML IV BOLUS
INTRAVENOUS | Status: DC | PRN
  Administered 2024-02-24: 150 mg via INTRAVENOUS

## 2024-02-24 MED ORDER — FENTANYL CITRATE PF 50 MCG/ML IJ SOSY
25.0000 ug | PREFILLED_SYRINGE | INTRAMUSCULAR | Status: DC | PRN
  Administered 2024-02-24 (×3): 50 ug via INTRAVENOUS

## 2024-02-24 MED ORDER — BUPIVACAINE LIPOSOME 1.3 % IJ SUSP
INTRAMUSCULAR | Status: AC
  Filled 2024-02-24: qty 20

## 2024-02-24 MED ORDER — CARVEDILOL 12.5 MG PO TABS
12.5000 mg | ORAL_TABLET | Freq: Two times a day (BID) | ORAL | Status: DC
Start: 2024-02-24 — End: 2024-02-25
  Administered 2024-02-24 – 2024-02-25 (×2): 12.5 mg via ORAL
  Filled 2024-02-24 (×2): qty 1

## 2024-02-24 MED ORDER — SODIUM CHLORIDE 0.9 % IV BOLUS
1000.0000 mL | Freq: Once | INTRAVENOUS | Status: AC
Start: 2024-02-24 — End: 2024-02-24
  Administered 2024-02-24: 1000 mL via INTRAVENOUS

## 2024-02-24 MED ORDER — DOCUSATE SODIUM 100 MG PO CAPS: 100.0000 mg | ORAL_CAPSULE | Freq: Two times a day (BID) | ORAL | Status: AC

## 2024-02-24 MED ORDER — LACTATED RINGERS IR SOLN
Status: DC | PRN
  Administered 2024-02-24: 1000 mL

## 2024-02-24 MED ORDER — FENTANYL CITRATE (PF) 100 MCG/2ML IJ SOLN
INTRAMUSCULAR | Status: DC | PRN
  Administered 2024-02-24: 50 ug via INTRAVENOUS
  Administered 2024-02-24: 25 ug via INTRAVENOUS
  Administered 2024-02-24: 100 ug via INTRAVENOUS

## 2024-02-24 SURGICAL SUPPLY — 53 items
APPLICATOR COTTON TIP 6 STRL (MISCELLANEOUS) ×1 IMPLANT
BAG COUNTER SPONGE SURGICOUNT (BAG) IMPLANT
CATH FOLEY 2WAY SLVR 18FR 30CC (CATHETERS) ×1 IMPLANT
CATH TIEMANN FOLEY 18FR 5CC (CATHETERS) IMPLANT
CATH URTH STD 24FR FL 3W 2 (CATHETERS) ×1 IMPLANT
CHLORAPREP W/TINT 26 (MISCELLANEOUS) ×1 IMPLANT
COVER SURGICAL LIGHT HANDLE (MISCELLANEOUS) ×1 IMPLANT
COVER TIP SHEARS 8 DVNC (MISCELLANEOUS) ×1 IMPLANT
CUTTER ECHEON FLEX ENDO 45 340 (ENDOMECHANICALS) IMPLANT
DERMABOND ADVANCED .7 DNX12 (GAUZE/BANDAGES/DRESSINGS) ×1 IMPLANT
DRAPE ARM DVNC X/XI (DISPOSABLE) ×4 IMPLANT
DRAPE COLUMN DVNC XI (DISPOSABLE) ×1 IMPLANT
DRAPE SURG IRRIG POUCH 19X23 (DRAPES) ×1 IMPLANT
DRIVER NDL LRG 8 DVNC XI (INSTRUMENTS) ×2 IMPLANT
DRIVER NDLE LRG 8 DVNC XI (INSTRUMENTS) ×2 IMPLANT
DRSG TEGADERM 4X4.75 (GAUZE/BANDAGES/DRESSINGS) ×1 IMPLANT
ELECT PENCIL ROCKER SW 15FT (MISCELLANEOUS) ×1 IMPLANT
ELECT REM PT RETURN 15FT ADLT (MISCELLANEOUS) ×1 IMPLANT
FORCEPS BPLR LNG DVNC XI (INSTRUMENTS) ×1 IMPLANT
FORCEPS PROGRASP DVNC XI (FORCEP) ×1 IMPLANT
GAUZE SPONGE 4X4 12PLY STRL (GAUZE/BANDAGES/DRESSINGS) ×1 IMPLANT
GLOVE BIO SURGEON STRL SZ 6.5 (GLOVE) ×1 IMPLANT
GLOVE BIOGEL PI IND STRL 7.5 (GLOVE) IMPLANT
GLOVE SURG LX STRL 7.5 STRW (GLOVE) ×2 IMPLANT
GOWN STRL REUS W/ TWL XL LVL3 (GOWN DISPOSABLE) ×2 IMPLANT
GOWN STRL SURGICAL XL XLNG (GOWN DISPOSABLE) ×1 IMPLANT
HOLDER FOLEY CATH W/STRAP (MISCELLANEOUS) ×1 IMPLANT
IRRIGATION SUCT STRKRFLW 2 WTP (MISCELLANEOUS) ×1 IMPLANT
IV LACTATED RINGERS 1000ML (IV SOLUTION) ×1 IMPLANT
KIT TURNOVER KIT A (KITS) ×1 IMPLANT
NDL INSUFFLATION 14GA 120MM (NEEDLE) ×1 IMPLANT
NEEDLE INSUFFLATION 14GA 120MM (NEEDLE) ×1 IMPLANT
PACK ROBOT UROLOGY CUSTOM (CUSTOM PROCEDURE TRAY) ×1 IMPLANT
PAD POSITIONING PINK XL (MISCELLANEOUS) ×1 IMPLANT
PORT ACCESS TROCAR AIRSEAL 12 (TROCAR) ×1 IMPLANT
RELOAD STAPLE 45 4.1 GRN THCK (STAPLE) ×1 IMPLANT
SCISSORS LAP 5X45 EPIX DISP (ENDOMECHANICALS) IMPLANT
SCISSORS MNPLR CVD DVNC XI (INSTRUMENTS) ×1 IMPLANT
SEAL UNIV 5-12 XI (MISCELLANEOUS) ×4 IMPLANT
SET TRI-LUMEN FLTR TB AIRSEAL (TUBING) ×1 IMPLANT
SOLUTION ELECTROSURG ANTI STCK (MISCELLANEOUS) ×1 IMPLANT
SPIKE FLUID TRANSFER (MISCELLANEOUS) ×1 IMPLANT
SPONGE T-LAP 4X18 ~~LOC~~+RFID (SPONGE) IMPLANT
SUT ETHILON 3 0 PS 1 (SUTURE) ×1 IMPLANT
SUT MNCRL AB 4-0 PS2 18 (SUTURE) ×2 IMPLANT
SUT PDS AB 1 CT1 27 (SUTURE) ×2 IMPLANT
SUT VIC AB 0 CT1 27XBRD ANTBC (SUTURE) ×3 IMPLANT
SUT VIC AB 2-0 SH 27X BRD (SUTURE) ×1 IMPLANT
SUT VICRYL 0 UR6 27IN ABS (SUTURE) ×1 IMPLANT
SUT VLOC 180 2-0 9IN GS21 (SUTURE) ×2 IMPLANT
SUTURE VLOC BRB 180 ABS3/0GR12 (SUTURE) ×2 IMPLANT
SYSTEM BAG RETRIEVAL 10MM (BASKET) ×1 IMPLANT
WATER STERILE IRR 1000ML POUR (IV SOLUTION) ×1 IMPLANT

## 2024-02-24 NOTE — Anesthesia Postprocedure Evaluation (Signed)
 Anesthesia Post Note  Patient: Michael Houston  Procedure(s) Performed: PROSTATECTOMY, SIMPLE, ROBOT-ASSISTED (Pelvis)     Patient location during evaluation: PACU Anesthesia Type: General Level of consciousness: awake and alert Pain management: pain level controlled Vital Signs Assessment: post-procedure vital signs reviewed and stable Respiratory status: spontaneous breathing, nonlabored ventilation, respiratory function stable and patient connected to nasal cannula oxygen Cardiovascular status: blood pressure returned to baseline and stable Postop Assessment: no apparent nausea or vomiting Anesthetic complications: no   No notable events documented.  Last Vitals:  Vitals:   02/24/24 1300 02/24/24 1317  BP: 120/69 (!) 151/70  Pulse: (!) 45 (!) 47  Resp: 16 16  Temp:  (!) 36.4 C  SpO2: 95% 94%    Last Pain:  Vitals:   02/24/24 1349  TempSrc:   PainSc: 8                  Maryah Marinaro P Meeyah Ovitt

## 2024-02-24 NOTE — Transfer of Care (Signed)
 Immediate Anesthesia Transfer of Care Note  Patient: Michael Houston  Procedure(s) Performed: PROSTATECTOMY, SIMPLE, ROBOT-ASSISTED (Pelvis)  Patient Location: PACU  Anesthesia Type:General  Level of Consciousness: awake  Airway & Oxygen Therapy: Patient Spontanous Breathing and Patient connected to face mask oxygen  Post-op Assessment: Report given to RN and Post -op Vital signs reviewed and stable  Post vital signs: Reviewed and stable  Last Vitals:  Vitals Value Taken Time  BP 145/68 02/24/24 10:06  Temp    Pulse 51 02/24/24 10:11  Resp 12 02/24/24 10:11  SpO2 96 % 02/24/24 10:11  Vitals shown include unfiled device data.  Last Pain:  Vitals:   02/24/24 0637  TempSrc:   PainSc: 0-No pain         Complications: No notable events documented.

## 2024-02-24 NOTE — Anesthesia Procedure Notes (Signed)
 Procedure Name: Intubation Date/Time: 02/24/2024 7:49 AM  Performed by: Roslynn Waddell LABOR, CRNAPre-anesthesia Checklist: Patient identified, Emergency Drugs available, Suction available and Patient being monitored Patient Re-evaluated:Patient Re-evaluated prior to induction Oxygen Delivery Method: Circle System Utilized Preoxygenation: Pre-oxygenation with 100% oxygen Induction Type: IV induction Ventilation: Mask ventilation without difficulty Laryngoscope Size: Glidescope and 4 Tube type: Oral Tube size: 7.5 mm Number of attempts: 1 Airway Equipment and Method: Stylet and Oral airway Placement Confirmation: ETT inserted through vocal cords under direct vision, positive ETCO2 and breath sounds checked- equal and bilateral Secured at: 23 cm Tube secured with: Tape Dental Injury: Teeth and Oropharynx as per pre-operative assessment  Comments: Patient with preop blood blister of lower middle of lip and this was aggravated during intubation. Complicated intubation. GS 3 blade used by CRNA and able to get view and pass ETT into trachea. However, heard cuff leak on check after 1st intubation. GS blade put back in and cuff above glottis. Attempts made to advance ETT but unsuccessful. Patient extubated and reintubated with GS. Good volumes post extubation, but after MDA left room cuff leak noted. MDA back to room and ETT advanced further and more air added to cuff. After this, no issues with ETT.

## 2024-02-24 NOTE — Op Note (Signed)
 NAME: Michael Houston, Michael Houston MEDICAL RECORD NO: [id number] ACCOUNT NO: [id number] DATE OF BIRTH: 04/12/1946 FACILITY: [facility] LOCATION: [id number] PHYSICIAN: Ricardo Likens, MD  Operative Report   SURGEON:  Ricardo Likens, MD  PREOPERATIVE DIAGNOSIS:  Medication refractory prostatic hypertrophy.  PROCEDURE PERFORMED:  Robotic-assisted laparoscopic simple prostatectomy.  ESTIMATED BLOOD LOSS:  50 mL.  COMPLICATIONS: None.  SPECIMENS:  Prostate adenoma for permanent pathology.  FINDINGS: 1.  Mild diverticulosis without diverticulitis. 2.  Large bilobar prostatic hypertrophy. 3.  Wide open urinary channel from the bladder neck to the membranous urethra following simple prostatectomy.  ASSISTANT:  Alan Hammonds, PA.  DRAIN: 1.  Jackson-Pratt drain to bulb suction. 2.  Foley catheter to straight drain.  INDICATIONS:  The patient is a very pleasant 78 year old man with a longstanding history of prostatic hypertrophy and obstructive voiding.  He has been on maximum medical therapy with finasteride  and anticholinergics for years. His symptoms, however, are  becoming more progressive and bothersome. Imaging revealed a prostate that is approximately 120 g.  Screening for cancer is unremarkable.  Options were discussed including continued care versus self-cath regimens versus outlet procedures, and given his  prostate is greater than 100 g, a simple prostatectomy being most definitive.  He presents to us  today.  Informed consent was obtained and placed in the medical record.  PROCEDURE IN DETAIL:  The patient being Michael Houston verified and the procedure being simple prostatectomy was confirmed.  A procedure timeout was performed.  Intravenous antibiotics administered.  General endotracheal anesthesia was induced.  The  patient was placed into a low lithotomy position.  Sterile field was created, prepping and draping the patient's penis, perineum, and proximal thigh using iodine and his infraxiphoid abdomen  using chlorhexidine  gluconate.  After he was further fastened  to the operating table using 3-inch tape over foam padding across the supraxiphoid chest, his arms were tucked to the side with gel rolls.  A test of steep Trendelenburg position was performed. He was found to be suitably positioned.  A Foley catheter  was placed via straight drain.  Next, a high-flow low pressure pneumoperitoneum was obtained using Veress technique in the supraumbilical midline having passed the aspiration and drop test.  An 8-mm robotic camera port was then placed in the same  location.  Laparoscopic examination of the peritoneal cavity revealed no significant adhesions, no visceral injury. There was some mild colonic redundancy seen in the sigmoid and some diverticulosis without diverticulitis.  Additional ports were placed  as follows:  A right paramedian 8 mm robotic port, a right far lateral 12 mm AirSeal assistant port, right paramedian 5 mm suction port, a left paramedian 8 mm robotic port, and a left far lateral 8 mm robotic port.  The robot was docked and passed the  electronic checks.  Initial attention was directed at development of space of Retzius.  An incision was made lateral to the right medial umbilical ligament towards the area of the internal ring and then coursing along the iliac vessels towards the area  of the right ureter.  The right vas deferens was encountered and purposely ligated and used as a medial bucket handle.  The right bladder wall was swept away from the pelvic sidewall towards the area of the endopelvic fascia on the right side.  A  mirror-image dissection was performed on the left side.  Anterior attachment was taken down with cautery scissors to expose the anterior base of the bladder neck-prostate junction, which was defatted to better denote this.  The endopelvic fascia was then  swept away from the apical aspect of the prostate just enough to visualize the dorsal venous complex. This was  controlled using green load vascular stapler taking exquisite care to avoid membranous urethral injury did not occur.  Next, an inverted U  cystotomy was made approximately 2 cm proximal to the area of the true bladder neck.  This exposed the very large bilobar prostate adenoma. Next, 3 stay sutures of 0 Vicryl were placed in the right lobe, left lobe, and posterior aspect of the adenoma,  respectively. Using superior traction on the stay sutures, the adenoma plane was entered posteriorly at a distance of approximately 1.5 cm distal to the trigonal ridge.  The adenoma plane was entered and then carried towards the area of the apex of the  prostate as denoted by anterior curvature first at the 6 o'clock position. This was then swept laterally on the right side towards the 3 o'clock position and then finally the 12 o'clock position, freeing up the right-sided posterior adenoma. Next, the  adenoma was freed up in a similar fashion on the left side towards the 9 o'clock position and 12 o'clock position, respectively. Several small arterioles were coagulated using point coagulation current and exquisite care was taken to avoid capsular  perforation, which did not occur. This provided excellent mobility of the adenoma, which was placed on further superior traction and final apical dissection was performed by performing a butterfly incision on the 12 o'clock position of the adenoma all  the way to the area of the apex, which was then released from membranous urethra.  After completely freed up, the large adenoma specimen was placed in an EndoCatch bag for later retrieval.  Prostate fossa was carefully inspected.  Additional hemostasis  was achieved with point coagulation with current cautery, which provided excellent hemostasis.  Next, a mucosal advancement was performed using a double-armed 3-0 V-Loc suture, reapproximating the posterior bladder neck mucosa to the membranous urethra.  This was performed for  approximately the posterior 50% of the circumference and resulted in excellent advancement without tension. The ureteral orifices remained visibly patent and away from this.  The cystotomy was then closed using 2 separate running  suture lines of 2-0 Stratafix, meeting at the 12 o'clock position and tied together and a new 3-way Foley catheter was placed per urethra to straight drainage with 30 mL of water in the balloon.  This was irrigated quantitatively Hemostasis was  excellent.  We achieved the goal of extirpated portion of the procedure today.  A closed suction drain was brought through the previous left lateral-most robotic port site into the area of the peritoneal cavity.  The robot was then undocked.  The right  lateral-most assistant port site was closed at the fascia using a Carter-Thomason suture passer with 0 Vicryl. The specimen was retrieved by extending the previous camera port site superiorly and inferiorly with a total distance of approximately 4 cm.   Adenoma specimen was removed and set aside for permanent pathology. The extraction site was closed at the level of the fascia using figure-of-eight PDS x3 followed by reapproximation of Scarpa's with a running Vicryl.  All incision sites were infiltrated  with dilute lipolyzed Marcaine  and closed at the level of the skin using subcuticular Monocryl followed by Dermabond.  The procedure was then terminated.  The patient tolerated the procedure well.  No immediate periprocedural complications.  The patient  was taken to the postanesthesia care unit in stable condition.  Plan for observation and admission.  Please note, first assistant, Alan Hammonds was crucial to all portions of surgery today.  She provided invaluable retraction, suctioning, vascular stapling, specimen manipulation, robotic instrument exchange, and general first assistance.   NIK D: 02/24/2024 10:02:51 am T: 02/24/2024 11:37:00 pm  JOB: [id number]/ [id number]

## 2024-02-24 NOTE — Brief Op Note (Signed)
 02/24/2024  9:55 AM  PATIENT:  Michael Houston  78 y.o. male  PRE-OPERATIVE DIAGNOSIS:  VERY LARGE PROSTATE  POST-OPERATIVE DIAGNOSIS:  VERY LARGE PROSTATE  PROCEDURE:  Procedure(s): PROSTATECTOMY, SIMPLE, ROBOT-ASSISTED (N/A)  SURGEON:  Surgeons and Role:    * Manny, Ricardo KATHEE Raddle., MD - Primary  PHYSICIAN ASSISTANT:   ASSISTANTS: Alan Hammonds PA  ANESTHESIA:   local and general  EBL:  50 mL   BLOOD ADMINISTERED:none  DRAINS: JP to bulb; Foley to gravity   LOCAL MEDICATIONS USED:  MARCAINE      SPECIMEN:  Source of Specimen:  prostate adenoma  DISPOSITION OF SPECIMEN:  PATHOLOGY  COUNTS:  YES  TOURNIQUET:  * No tourniquets in log *  DICTATION: .Other Dictation: Dictation Number [id number]  PLAN OF CARE: Admit for overnight observation  PATIENT DISPOSITION:  PACU - hemodynamically stable.   Delay start of Pharmacological VTE agent (>24hrs) due to surgical blood loss or risk of bleeding: yes

## 2024-02-24 NOTE — Discharge Instructions (Addendum)
 1- Drain Sites - You may have some mild persistent drainage from old drain site for several days, this is normal. This can be covered with cotton gauze for convenience.  2 - Stiches - Your stitches are all dissolvable. You may notice a "loose thread" at your incisions, these are normal and require no intervention. You may cut them flush to the skin with fingernail clippers if needed for comfort.  3 - Diet - No restrictions  4 - Activity - No heavy lifting / straining (any activities that require valsalva or "bearing down") x 4 weeks. Otherwise, no restrictions.  5 - Bathing - You may shower immediately. Do not take a bath or get into swimming pool where incision sites are submersed in water x 4 weeks.   6 - Catheter - Will remain in place until removed at your next appointment. It may be cleaned with soap and water in the shower. It may be disconnected from the drain bag while in the shower to avoid tripping over the tube. You may apply Neosporin or Vaseline ointment as needed to the tip of the penis where the catheter inserts to reduce friction and irritation in this spot.   7 - When to Call the Doctor - Call MD for any fever >102, any acute wound problems, or any severe nausea / vomiting. You can call the [facility] [phone]) 24 hours a day 365 days a year. It will roll-over to the answering service and on-call physician after hours.   You may resume aspirin, advil, aleve, vitamins, and supplements 7 days after surgery.

## 2024-02-24 NOTE — H&P (Signed)
 Michael Houston is an 78 y.o. male.    Chief Complaint: Pre-OP Simple Prastatectomy  HPI:   1 - Med Refractory Prostatic Hypertrophy - progressive obstructive and irritative symptoms x years on finasteride  + silodosin. 2025 TRUS , no median. PVR . Urocuff PDet 160 at [facility] 74ml/sec (obstructed).   2 - Prostate Screening -  2025 - PSA 1.26 (finasteride ) at age 82   PMH sig for DM2/ozempic, bilateral open inguinal hernia repair, lap chole, lap adrenal. NO ischemic CV disease /blood thinners. 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 His PCP is Marcellus Baptist MD with [facility] in [place]   Today  Michael Houston  is seen to proceed with simple prostatectomy. Most recetn UA without infecitous parameters. Hgb 15, Cr 0.9, A1c 7 most recently.   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 No pertinent family history. Social History:  reports that he has never smoked. He has never used smokeless tobacco. He reports that he does not currently use alcohol. He reports that he does not use drugs.  Allergies:  Allergies  Allergen Reactions   Iodine-131 Itching   Penicillins Hives   Semaglutide Other (See Comments)    Ozempic Severe pancreatitis    Medications Prior to Admission  Medication Sig Dispense Refill   amLODipine   (NORVASC ) 5 MG tablet Take 5 mg by mouth daily.     carvedilol  (COREG ) 12.5 MG tablet Take 12.5 mg by mouth 2 (two) times daily with a meal.     diclofenac Sodium (VOLTAREN ARTHRITIS PAIN) 1 % GEL Apply 2 g topically daily as needed (Shoulder pain).     febuxostat (ULORIC) 40 MG tablet Take 40 mg by mouth daily.     finasteride  (PROSCAR ) 5 MG tablet Take 5 mg by mouth daily.     glipiZIDE (GLUCOTROL XL) 10 MG 24 hr tablet Take 10 mg by mouth daily.     hydrALAZINE  (APRESOLINE ) 25 MG tablet Take 25 mg by mouth 3 (three) times daily.     JARDIANCE 25 MG TABS tablet Take 25 mg by mouth daily.     Magnesium  250 MG TABS Take 250 mg by mouth daily. gummy     mesalamine (APRISO) 0.375 g 24 hr capsule Take 375 mg by mouth daily as needed (crohn's disease).     Misc Natural Products (BEET ROOT PO) Take 300 mg by mouth daily. Gummy     nitroGLYCERIN (NITROSTAT) 0.4 MG SL tablet Place 0.4 mg under the tongue every 5 (five) minutes as needed for chest pain.     pantoprazole  (  PROTONIX ) 40 MG tablet Take 40 mg by mouth 2 (two) times a week.     silodosin (RAPAFLO) 8 MG CAPS capsule Take 8 mg by mouth daily with breakfast.     spironolactone  (ALDACTONE ) 25 MG tablet Take 25 mg by mouth daily.     tadalafil (CIALIS) 20 MG tablet Take 20 mg by mouth daily as needed for erectile dysfunction.     telmisartan (MICARDIS) 80 MG tablet Take 80 mg by mouth daily.     TOUJEO SOLOSTAR 300 UNIT/ML Solostar Pen Inject 15 Units into the skin daily.     TURMERIC PO Take 1,000 mg by mouth daily.     fluticasone (FLONASE) 50 MCG/ACT nasal spray Place 2 sprays into both nostrils daily as needed for rhinitis.      Results for orders placed or performed during the hospital encounter of 02/24/24 (from the past 48 hours)  Glucose, capillary     Status: Abnormal   Collection Time: 02/24/24  5:49 AM  Result Value Ref Range   Glucose-Capillary 186 (H) 70 - 99 mg/dL    Comment: Glucose reference range applies only to samples  taken after fasting for at least 8 hours.   Comment 1 Notify RN    No results found.  Review of Systems  Constitutional:  Negative for chills and fever.  Genitourinary:  Positive for difficulty urinating.  All other systems reviewed and are negative.   Blood pressure (!) 155/74, pulse (!) 52, temperature 98 F (36.7 C), temperature source Oral, resp. rate 16, height 5' 7 (1.702 m), weight 78 kg, SpO2 95%. Physical Exam Vitals reviewed.  HENT:     Head: Normocephalic.  Eyes:     Pupils: Pupils are equal, round, and reactive to light.  Cardiovascular:     Rate and Rhythm: Normal rate.  Pulmonary:     Effort: Pulmonary effort is normal.  Abdominal:     General: Abdomen is flat.  Genitourinary:    Comments: No CVAT Musculoskeletal:        General: Normal range of motion.     Cervical back: Normal range of motion.  Skin:    General: Skin is warm.  Neurological:     General: No focal deficit present.     Mental Status: He is alert.  Psychiatric:        Mood and Affect: Mood normal.      Assessment/Plan  Proceed as planned with simple prostatecotmy. Risks, benefits, alternaives, expected peri-op course discussed previously and reiterated today.   Michael Houston., MD 02/24/2024, 6:43 AM

## 2024-02-24 NOTE — Anesthesia Preprocedure Evaluation (Signed)
 Anesthesia Evaluation  Patient identified by MRN, date of birth, ID band Patient awake    Reviewed: Allergy & Precautions, NPO status , Patient's Chart, lab work & pertinent test results  Airway Mallampati: II  TM Distance: >3 FB Neck ROM: Full    Dental no notable dental hx.    Pulmonary sleep apnea and Continuous Positive Airway Pressure Ventilation    Pulmonary exam normal        Cardiovascular hypertension, Pt. on medications + DOE   Rhythm:Regular Rate:Normal     Neuro/Psych negative neurological ROS  negative psych ROS   GI/Hepatic Neg liver ROS, PUD,GERD  Medicated,,  Endo/Other  diabetes, Type 2, Oral Hypoglycemic Agents    Renal/GU negative Renal ROS  negative genitourinary   Musculoskeletal  (+) Arthritis , Osteoarthritis,    Abdominal Normal abdominal exam  (+)   Peds  Hematology negative hematology ROS (+)   Anesthesia Other Findings   Reproductive/Obstetrics                              Anesthesia Physical Anesthesia Plan  ASA: 3  Anesthesia Plan: General   Post-op Pain Management:    Induction: Intravenous  PONV Risk Score and Plan: 2 and Ondansetron , Dexamethasone  and Treatment may vary due to age or medical condition  Airway Management Planned: Mask and Oral ETT  Additional Equipment: None  Intra-op Plan:   Post-operative Plan: Extubation in OR  Informed Consent: I have reviewed the patients History and Physical, chart, labs and discussed the procedure including the risks, benefits and alternatives for the proposed anesthesia with the patient or authorized representative who has indicated his/her understanding and acceptance.     Dental advisory given  Plan Discussed with: CRNA  Anesthesia Plan Comments:         Anesthesia Quick Evaluation

## 2024-02-25 ENCOUNTER — Other Ambulatory Visit: Payer: Self-pay | Admitting: Urology

## 2024-02-25 ENCOUNTER — Encounter (HOSPITAL_COMMUNITY): Payer: Self-pay | Admitting: Urology

## 2024-02-25 DIAGNOSIS — Z125 Encounter for screening for malignant neoplasm of prostate: Secondary | ICD-10-CM | POA: Diagnosis not present

## 2024-02-25 DIAGNOSIS — E119 Type 2 diabetes mellitus without complications: Secondary | ICD-10-CM | POA: Diagnosis not present

## 2024-02-25 DIAGNOSIS — Z79899 Other long term (current) drug therapy: Secondary | ICD-10-CM | POA: Diagnosis not present

## 2024-02-25 DIAGNOSIS — N401 Enlarged prostate with lower urinary tract symptoms: Secondary | ICD-10-CM | POA: Diagnosis not present

## 2024-02-25 DIAGNOSIS — N138 Other obstructive and reflux uropathy: Secondary | ICD-10-CM | POA: Diagnosis not present

## 2024-02-25 DIAGNOSIS — I10 Essential (primary) hypertension: Secondary | ICD-10-CM | POA: Diagnosis not present

## 2024-02-25 DIAGNOSIS — K5730 Diverticulosis of large intestine without perforation or abscess without bleeding: Secondary | ICD-10-CM | POA: Diagnosis not present

## 2024-02-25 LAB — BASIC METABOLIC PANEL WITH GFR
Anion gap: 11 (ref 5–15)
BUN: 21 mg/dL (ref 8–23)
CO2: 19 mmol/L — ABNORMAL LOW (ref 22–32)
Calcium: 8.2 mg/dL — ABNORMAL LOW (ref 8.9–10.3)
Chloride: 107 mmol/L (ref 98–111)
Creatinine, Ser: 1.17 mg/dL (ref 0.61–1.24)
GFR, Estimated: >60 mL/min (ref >60)
Glucose, Bld: 159 mg/dL — ABNORMAL HIGH (ref 70–99)
Potassium: 4.1 mmol/L (ref 3.5–5.1)
Sodium: 137 mmol/L (ref 135–145)

## 2024-02-25 LAB — HEMOGLOBIN AND HEMATOCRIT, BLOOD
HCT: 42 % (ref 39.0–52.0)
Hemoglobin: 14.1 g/dL (ref 13.0–17.0)

## 2024-02-25 LAB — GLUCOSE, CAPILLARY
Glucose-Capillary: 142 mg/dL — ABNORMAL HIGH (ref 70–99)
Glucose-Capillary: 270 mg/dL — ABNORMAL HIGH (ref 70–99)

## 2024-02-25 NOTE — Progress Notes (Signed)
 AVS given to patient and explained at the bedside. Medications and follow up appointments have been explained with pt verbalizing understanding.

## 2024-02-25 NOTE — Discharge Summary (Signed)
 Date of admission: 02/24/2024  Date of discharge: 02/25/2024  Admission diagnosis: BPH  Discharge diagnosis: BPH  History and Physical: For full details, please see admission history and physical. Briefly, Michael Houston is a 78 y.o. gentleman with enlarged prostate.  After discussing management/treatment options, he elected to proceed with surgical treatment.  Hospital Course: Michael Houston was taken to the operating room on 02/24/2024 and underwent a robotic assisted laparoscopic simple prostatectomy. He tolerated this procedure well and without complications. Postoperatively, he was able to be transferred to a regular hospital room following recovery from anesthesia.  He was able to begin ambulating the night of surgery. He remained hemodynamically stable overnight.  He had excellent urine output with appropriately minimal output from his pelvic drain and his pelvic drain was removed on POD #1.  He was transitioned to oral pain medication, tolerated a clear liquid diet, and had met all discharge criteria and was able to be discharged home later on POD#1.  Laboratory values:  Recent Labs    02/24/24 1013 02/25/24 0400  HGB 14.5 14.1  HCT 44.3 42.0    Disposition: Home  Discharge instruction: He was instructed to be ambulatory but to refrain from heavy lifting, strenuous activity, or driving. He was instructed on urethral catheter care.  Discharge medications:   Allergies as of 02/25/2024       Reactions   Iodine-131 Itching   Penicillins Hives   Semaglutide Other (See Comments)   Ozempic Severe pancreatitis        Medication List     STOP taking these medications    BEET ROOT PO   Magnesium  250 MG Tabs   silodosin 8 MG Caps capsule Commonly known as: RAPAFLO   TURMERIC PO       TAKE these medications    amLODipine  5 MG tablet Commonly known as: NORVASC  Take 5 mg by mouth daily.   carvedilol  12.5 MG tablet Commonly known as: COREG  Take 12.5 mg by mouth 2 (two)  times daily with a meal.   docusate sodium  100 MG capsule Commonly known as: COLACE Take 1 capsule (100 mg total) by mouth 2 (two) times daily.   febuxostat 40 MG tablet Commonly known as: ULORIC Take 40 mg by mouth daily.   finasteride  5 MG tablet Commonly known as: PROSCAR  Take 5 mg by mouth daily.   fluticasone 50 MCG/ACT nasal spray Commonly known as: FLONASE Place 2 sprays into both nostrils daily as needed for rhinitis.   glipiZIDE 10 MG 24 hr tablet Commonly known as: GLUCOTROL XL Take 10 mg by mouth daily.   hydrALAZINE  25 MG tablet Commonly known as: APRESOLINE  Take 25 mg by mouth 3 (three) times daily.   HYDROcodone -acetaminophen  5-325 MG tablet Commonly known as: NORCO/VICODIN Take 1-2 tablets by mouth every 6 (six) hours as needed for moderate pain (pain score 4-6) or severe pain (pain score 7-10).   Jardiance 25 MG Tabs tablet Generic drug: empagliflozin Take 25 mg by mouth daily.   mesalamine 0.375 g 24 hr capsule Commonly known as: APRISO Take 375 mg by mouth daily as needed (crohn's disease).   nitroGLYCERIN 0.4 MG SL tablet Commonly known as: NITROSTAT Place 0.4 mg under the tongue every 5 (five) minutes as needed for chest pain.   pantoprazole  40 MG tablet Commonly known as: PROTONIX  Take 40 mg by mouth 2 (two) times a week.   spironolactone  25 MG tablet Commonly known as: ALDACTONE  Take 25 mg by mouth daily.   sulfamethoxazole -trimethoprim  800-160 MG tablet  Commonly known as: BACTRIM  DS Take 1 tablet by mouth 2 (two) times daily. Start the day prior to foley removal appointment   tadalafil 20 MG tablet Commonly known as: CIALIS Take 20 mg by mouth daily as needed for erectile dysfunction.   telmisartan 80 MG tablet Commonly known as: MICARDIS Take 80 mg by mouth daily.   Toujeo SoloStar 300 UNIT/ML Solostar Pen Generic drug: insulin  glargine (1 Unit Dial) Inject 15 Units into the skin daily.   Voltaren Arthritis Pain 1 %  Gel Generic drug: diclofenac Sodium Apply 2 g topically daily as needed (Shoulder pain).        Followup: He will followup in 1 week for catheter removal and to discuss his surgical pathology results.

## 2024-02-25 NOTE — Progress Notes (Signed)
 Post-op note  Subjective: The patient is doing well.  No complaints. Belly slightly distended. Pain well controlled. Foley draining strawberry colored urine, no clot.   Objective: Vital signs in last 24 hours: Temp:  [97.5 F (36.4 C)-98.1 F (36.7 C)] 98 F (36.7 C) (09/27 0657) Pulse Rate:  [43-59] 47 (09/27 0657) Resp:  [9-19] 17 (09/27 0657) BP: (120-152)/(65-88) 141/70 (09/27 0657) SpO2:  [93 %-100 %] 94 % (09/27 0657)  Intake/Output from previous day: 09/26 0701 - 09/27 0700 In: 1379.1 [P.O.:240; I.V.:1079.1] Out: 1900 [Urine:1760; Drains:90; Blood:50] Intake/Output this shift: No intake/output data recorded.  Physical Exam:  General: Alert and oriented. Abdomen: Soft, Nondistended. Incisions: Clean and dry.  Lab Results: Recent Labs    02/24/24 1013 02/25/24 0400  HGB 14.5 14.1  HCT 44.3 42.0    Assessment/Plan: POD#0  1) Continue to monitor    LOS: 0 days   Lyle LOISE Civil 02/25/2024, 7:21 AM

## 2024-02-27 LAB — SURGICAL PATHOLOGY

## 2024-02-29 DIAGNOSIS — R310 Gross hematuria: Secondary | ICD-10-CM | POA: Diagnosis not present

## 2024-03-05 DIAGNOSIS — N401 Enlarged prostate with lower urinary tract symptoms: Secondary | ICD-10-CM | POA: Diagnosis not present

## 2024-03-05 DIAGNOSIS — C61 Malignant neoplasm of prostate: Secondary | ICD-10-CM | POA: Diagnosis not present

## 2024-03-15 ENCOUNTER — Other Ambulatory Visit: Payer: Self-pay

## 2024-03-15 ENCOUNTER — Encounter (HOSPITAL_COMMUNITY): Payer: Self-pay | Admitting: *Deleted

## 2024-03-15 ENCOUNTER — Emergency Department (HOSPITAL_COMMUNITY)

## 2024-03-15 ENCOUNTER — Emergency Department (HOSPITAL_COMMUNITY)
Admission: EM | Admit: 2024-03-15 | Discharge: 2024-03-15 | Disposition: A | Discharge status: Home / Self Care | Source: Ambulatory Visit | Attending: Emergency Medicine | Admitting: Emergency Medicine

## 2024-03-15 DIAGNOSIS — N200 Calculus of kidney: Secondary | ICD-10-CM | POA: Diagnosis not present

## 2024-03-15 DIAGNOSIS — Z9079 Acquired absence of other genital organ(s): Secondary | ICD-10-CM | POA: Diagnosis not present

## 2024-03-15 DIAGNOSIS — M5136 Other intervertebral disc degeneration, lumbar region with discogenic back pain only: Secondary | ICD-10-CM | POA: Diagnosis not present

## 2024-03-15 DIAGNOSIS — M5126 Other intervertebral disc displacement, lumbar region: Secondary | ICD-10-CM | POA: Diagnosis not present

## 2024-03-15 DIAGNOSIS — E119 Type 2 diabetes mellitus without complications: Secondary | ICD-10-CM | POA: Insufficient documentation

## 2024-03-15 DIAGNOSIS — I700 Atherosclerosis of aorta: Secondary | ICD-10-CM | POA: Diagnosis not present

## 2024-03-15 DIAGNOSIS — R102 Pelvic and perineal pain unspecified side: Secondary | ICD-10-CM | POA: Diagnosis not present

## 2024-03-15 DIAGNOSIS — Z79899 Other long term (current) drug therapy: Secondary | ICD-10-CM | POA: Insufficient documentation

## 2024-03-15 DIAGNOSIS — K5730 Diverticulosis of large intestine without perforation or abscess without bleeding: Secondary | ICD-10-CM | POA: Diagnosis not present

## 2024-03-15 DIAGNOSIS — I10 Essential (primary) hypertension: Secondary | ICD-10-CM | POA: Diagnosis not present

## 2024-03-15 DIAGNOSIS — M545 Low back pain, unspecified: Secondary | ICD-10-CM | POA: Diagnosis not present

## 2024-03-15 DIAGNOSIS — K5790 Diverticulosis of intestine, part unspecified, without perforation or abscess without bleeding: Secondary | ICD-10-CM | POA: Diagnosis not present

## 2024-03-15 DIAGNOSIS — M48061 Spinal stenosis, lumbar region without neurogenic claudication: Secondary | ICD-10-CM | POA: Diagnosis not present

## 2024-03-15 DIAGNOSIS — Z85828 Personal history of other malignant neoplasm of skin: Secondary | ICD-10-CM | POA: Insufficient documentation

## 2024-03-15 DIAGNOSIS — Z7984 Long term (current) use of oral hypoglycemic drugs: Secondary | ICD-10-CM | POA: Insufficient documentation

## 2024-03-15 DIAGNOSIS — M47817 Spondylosis without myelopathy or radiculopathy, lumbosacral region: Secondary | ICD-10-CM | POA: Diagnosis not present

## 2024-03-15 DIAGNOSIS — M47816 Spondylosis without myelopathy or radiculopathy, lumbar region: Secondary | ICD-10-CM | POA: Diagnosis not present

## 2024-03-15 DIAGNOSIS — M4800 Spinal stenosis, site unspecified: Secondary | ICD-10-CM

## 2024-03-15 LAB — COMPREHENSIVE METABOLIC PANEL WITH GFR
ALT: 40 U/L (ref 0–44)
AST: 29 U/L (ref 15–41)
Albumin: 4.4 g/dL (ref 3.5–5.0)
Alkaline Phosphatase: 95 U/L (ref 38–126)
Anion gap: 11 (ref 5–15)
BUN: 27 mg/dL — ABNORMAL HIGH (ref 8–23)
CO2: 25 mmol/L (ref 22–32)
Calcium: 9.9 mg/dL (ref 8.9–10.3)
Chloride: 101 mmol/L (ref 98–111)
Creatinine, Ser: 1.05 mg/dL (ref 0.61–1.24)
GFR, Estimated: >60 mL/min (ref >60)
Glucose, Bld: 200 mg/dL — ABNORMAL HIGH (ref 70–99)
Potassium: 4.2 mmol/L (ref 3.5–5.1)
Sodium: 137 mmol/L (ref 135–145)
Total Bilirubin: 0.6 mg/dL (ref 0.0–1.2)
Total Protein: 7.4 g/dL (ref 6.5–8.1)

## 2024-03-15 LAB — URINALYSIS, ROUTINE W REFLEX MICROSCOPIC
Bacteria, UA: NONE SEEN
Bilirubin Urine: NEGATIVE
Glucose, UA: >=500 mg/dL — AB
Ketones, ur: NEGATIVE mg/dL
Nitrite: NEGATIVE
Protein, ur: 100 mg/dL — AB
RBC / HPF: >50 RBC/hpf (ref 0–5)
Specific Gravity, Urine: 1.021 (ref 1.005–1.030)
WBC, UA: >50 WBC/hpf (ref 0–5)
pH: 5 (ref 5.0–8.0)

## 2024-03-15 LAB — CBC WITH DIFFERENTIAL/PLATELET
Abs Immature Granulocytes: 0.03 K/uL (ref 0.00–0.07)
Basophils Absolute: 0 K/uL (ref 0.0–0.1)
Basophils Relative: 1 %
Eosinophils Absolute: 0.2 K/uL (ref 0.0–0.5)
Eosinophils Relative: 2 %
HCT: 47.2 % (ref 39.0–52.0)
Hemoglobin: 15.3 g/dL (ref 13.0–17.0)
Immature Granulocytes: 0 %
Lymphocytes Relative: 16 %
Lymphs Abs: 1.1 K/uL (ref 0.7–4.0)
MCH: 29.7 pg (ref 26.0–34.0)
MCHC: 32.4 g/dL (ref 30.0–36.0)
MCV: 91.7 fL (ref 80.0–100.0)
Monocytes Absolute: 0.8 K/uL (ref 0.1–1.0)
Monocytes Relative: 11 %
Neutro Abs: 5 K/uL (ref 1.7–7.7)
Neutrophils Relative %: 70 %
Platelets: 235 K/uL (ref 150–400)
RBC: 5.15 MIL/uL (ref 4.22–5.81)
RDW: 14.1 % (ref 11.5–15.5)
WBC: 7.1 K/uL (ref 4.0–10.5)
nRBC: 0 % (ref 0.0–0.2)

## 2024-03-15 LAB — LIPASE, BLOOD: Lipase: 79 U/L — ABNORMAL HIGH (ref 11–51)

## 2024-03-15 MED ORDER — ONDANSETRON HCL 4 MG/2ML IJ SOLN
4.0000 mg | Freq: Once | INTRAMUSCULAR | Status: AC
  Administered 2024-03-15: 4 mg via INTRAVENOUS
  Filled 2024-03-15: qty 2

## 2024-03-15 MED ORDER — CYCLOBENZAPRINE HCL 5 MG PO TABS: 5.0000 mg | ORAL_TABLET | Freq: Two times a day (BID) | ORAL | 0 refills | Status: AC | PRN

## 2024-03-15 MED ORDER — KETOROLAC TROMETHAMINE 15 MG/ML IJ SOLN
15.0000 mg | Freq: Once | INTRAMUSCULAR | Status: AC
  Administered 2024-03-15: 15 mg via INTRAVENOUS
  Filled 2024-03-15: qty 1

## 2024-03-15 MED ORDER — LIDOCAINE 5 % EX PTCH
1.0000 | MEDICATED_PATCH | Freq: Once | CUTANEOUS | Status: DC
  Administered 2024-03-15: 1 via TRANSDERMAL
  Filled 2024-03-15: qty 1

## 2024-03-15 MED ORDER — HYDROCODONE-ACETAMINOPHEN 5-325 MG PO TABS: 2.0000 | ORAL_TABLET | ORAL | 0 refills | Status: AC | PRN

## 2024-03-15 MED ORDER — LIDOCAINE 5 % EX PTCH: 1.0000 | MEDICATED_PATCH | Freq: Every day | CUTANEOUS | 0 refills | Status: AC | PRN

## 2024-03-15 MED ORDER — SODIUM CHLORIDE 0.9 % IV BOLUS
1000.0000 mL | Freq: Once | INTRAVENOUS | Status: AC
  Administered 2024-03-15: 1000 mL via INTRAVENOUS

## 2024-03-15 MED ORDER — MORPHINE SULFATE (PF) 4 MG/ML IV SOLN
4.0000 mg | Freq: Once | INTRAVENOUS | Status: AC
  Administered 2024-03-15: 4 mg via INTRAVENOUS
  Filled 2024-03-15: qty 1

## 2024-03-15 NOTE — ED Triage Notes (Signed)
 BIB spouse from home for mid low back pain, ongoing and increasing over last week. Recent prostate surgery. Foley removed 1 week ago. Pain gradually worse since then. Some relief with norco taken last night. No meds PTA. Mentions urinary frequency and urgency. [facility] of [facility]. Alert, NAD, calm, interactive, steady gait. Denies other sx.

## 2024-03-15 NOTE — ED Notes (Signed)
 Patient transported to CT

## 2024-03-15 NOTE — ED Provider Notes (Addendum)
 [place] [facility] AT [facility] Provider Note  CSN: [id number] Arrival date & time: 03/15/24 9052  Chief Complaint(s) Back Pain  HPI Michael Houston is a 78 y.o. male with past medical history as below, significant for DM, hypertension, BPH, HLD, UC who presents to the ED with complaint of back pain  He is status post simple prostatectomy 9/26 Dr. Alvaro.  He had indwelling Foley catheter following the procedure and was removed recently.  Following removal of the catheter he has been having low back pain, increased urinary frequency.  His dysuria has resolved and his hematuria has also resolved.  Pain is primarily to his lumbar/sacral area.  Midline.  Does not radiate, denies any recent falls or injuries.  No numbness or weakness to his extremities.  No incontinence or overflow, no IV drug use, no fevers.  Improved with prescription pain medication.  Denies similar symptoms in the past.  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,   Patient Active Problem List   Diagnosis Date Noted   BPH with obstruction/lower urinary tract symptoms 02/24/2024   Essential hypertension 10/08/2019   Hyperlipidemia 10/08/2019   Ulcerative colitis (HCC) 10/08/2019   Chest discomfort 07/18/2017   DOE (dyspnea on exertion) 07/18/2017   Murmur 07/18/2017   Adrenal Cushing's syndrome 10/31/2013   OSA (obstructive sleep apnea) 08/02/2013   Surgery, other elective 08/02/2013   Dilation of pancreatic duct 06/14/2013   Adrenal mass 04/04/2013   Diverticulosis of intestine 07/09/2011   Erectile dysfunction 05/10/2011   Home  Medication(s) Prior to Admission medications   Medication Sig Start Date End Date Taking? Authorizing Provider  cyclobenzaprine (FLEXERIL) 5 MG tablet Take 1 tablet (5 mg total) by mouth 2 (two) times daily as needed for muscle spasms. 03/15/24  Yes Elnor Savant A, DO  HYDROcodone -acetaminophen  (NORCO/VICODIN) 5-325 MG tablet Take 2 tablets by mouth every 4 (four) hours as needed. 03/15/24  Yes Elnor Savant A, DO  lidocaine  (LIDODERM ) 5 % Place 1 patch onto the skin daily as needed. Remove & Discard patch within 12 hours or as directed by MD 03/15/24  Yes Elnor Savant A, DO  amLODipine  (NORVASC ) 5 MG tablet Take 5 mg by mouth daily. 07/13/19   [provider]  carvedilol  (COREG ) 12.5 MG tablet Take 12.5 mg by mouth 2 (two) times daily with a meal. 04/14/11   [provider]  diclofenac Sodium (VOLTAREN ARTHRITIS PAIN) 1 % GEL Apply 2 g topically daily as needed (Shoulder pain).    [provider]  docusate sodium  (COLACE) 100 MG capsule Take 1 capsule (100 mg total) by mouth 2 (two) times daily. 02/24/24   Cory Palma, PA-C  febuxostat (ULORIC) 40 MG tablet Take 40 mg by mouth daily.    [provider]  finasteride  (PROSCAR ) 5 MG tablet Take 5 mg by mouth daily.    [provider]  fluticasone (FLONASE) 50 MCG/ACT nasal spray Place 2 sprays into both nostrils daily as needed for rhinitis. 04/15/21   [provider]  glipiZIDE (GLUCOTROL XL) 10 MG 24 hr tablet Take 10 mg by mouth daily.    [provider]  hydrALAZINE  (APRESOLINE ) 25 MG tablet Take 25 mg by mouth 3 (three) times daily.    [provider]  JARDIANCE 25 MG TABS tablet Take 25 mg by mouth daily.    [provider]  mesalamine (APRISO) 0.375 g 24 hr capsule Take 375 mg by mouth daily as needed (crohn's disease). 08/01/19   [provider]  nitroGLYCERIN (NITROSTAT) 0.4 MG SL tablet Place 0.4 mg under the tongue every 5 (five) minutes as needed for  chest pain. 07/08/17   [provider]  pantoprazole  (PROTONIX ) 40 MG tablet Take 40 mg by mouth 2 (two) times a week. 07/08/17   [provider]  spironolactone  (ALDACTONE ) 25 MG tablet Take 25 mg by mouth daily.    [provider]  tadalafil (CIALIS) 20 MG tablet Take 20 mg by mouth daily as needed for erectile dysfunction.    [provider]  telmisartan (MICARDIS) 80 MG tablet Take 80 mg by mouth daily. 07/26/19   [provider]  TOUJEO SOLOSTAR 300 UNIT/ML Solostar Pen Inject 15 Units into the skin daily. 10/31/23   [provider]                                                                                                                                    Past Surgical History Past Surgical History:  Procedure Laterality Date   ADRENALECTOMY Left 2014   CHOLECYSTECTOMY     EYE SURGERY Bilateral    cataract extraction   FRACTURE SURGERY     HERNIA REPAIR     x3   Inguinal and femoral   MOHS SURGERY     left side of nose for BCC   TONSILLECTOMY     XI ROBOTIC ASSISTED SIMPLE PROSTATECTOMY N/A 02/24/2024   Procedure: PROSTATECTOMY, SIMPLE, ROBOT-ASSISTED;  Surgeon: Alvaro Ricardo KATHEE Mickey., MD;  Location: [facility];  Service: Urology;  Laterality: N/A;   Family History History reviewed. No pertinent family history.  Social History Social History   Tobacco Use   Smoking status: Never   Smokeless tobacco: Never  Vaping Use   Vaping status: Never Used  Substance Use Topics   Alcohol use: Not Currently   Drug use: Never   Allergies Iodine-131, Penicillins, and Semaglutide  Review of Systems A thorough review of systems was obtained and all systems are negative except as noted in the HPI and PMH.   Physical Exam Vital Signs  I have reviewed the triage vital signs BP (!) 163/82 (BP Location: Right Arm)   Pulse (!) 49   Temp 98 F (36.7 C) (Oral)   Resp 18   Wt 79.4 kg   SpO2 95%   BMI 27.41 kg/m  Physical Exam Vitals  and nursing note reviewed.  Constitutional:      General:  He is not in acute distress.    Appearance: Normal appearance. He is well-developed. He is not ill-appearing.  HENT:     Head: Normocephalic and atraumatic.     Right Ear: External ear normal.     Left Ear: External ear normal.     Nose: Nose normal.     Mouth/Throat:     Mouth: Mucous membranes are moist.  Eyes:     General: No scleral icterus.       Right eye: No discharge.        Left eye: No discharge.  Cardiovascular:     Rate and Rhythm: Normal rate.  Pulmonary:     Effort: Pulmonary effort is normal. No respiratory distress.     Breath sounds: No stridor.  Abdominal:     General: Abdomen is flat. There is no distension.     Palpations: Abdomen is soft.     Tenderness: There is no abdominal tenderness. There is no guarding.  Musculoskeletal:        General: No deformity.     Cervical back: No rigidity.       Back:     Comments:  no crepitus or step-off B/L LE NVI  Skin:    General: Skin is warm and dry.     Coloration: Skin is not cyanotic, jaundiced or pale.  Neurological:     Mental Status: He is alert.  Psychiatric:        Speech: Speech normal.        Behavior: Behavior normal. Behavior is cooperative.     ED Results and Treatments Labs (all labs ordered are listed, but only abnormal results are displayed) Labs Reviewed  COMPREHENSIVE METABOLIC PANEL WITH GFR - Abnormal; Notable for the following components:      Result Value   Glucose, Bld 200 (*)    BUN 27 (*)    All other components within normal limits  LIPASE, BLOOD - Abnormal; Notable for the following components:   Lipase 79 (*)    All other components within normal limits  URINALYSIS, ROUTINE W REFLEX MICROSCOPIC - Abnormal; Notable for the following components:   APPearance HAZY (*)    Glucose, UA >=500 (*)    Hgb urine dipstick LARGE (*)    Protein, ur 100 (*)    Leukocytes,Ua SMALL (*)    All other components within normal limits   CBC WITH DIFFERENTIAL/PLATELET                                                                                                                          Radiology CT L-SPINE NO CHARGE Result Date: 03/15/2024 EXAM: CT OF THE LUMBAR SPINE WITHOUT CONTRAST 03/15/2024 12:06:00 PM TECHNIQUE: CT of the lumbar spine was performed without the administration of intravenous contrast. Multiplanar reformatted images are provided for review. Automated exposure control, iterative reconstruction, and/or weight based adjustment of the mA/kV was utilized to reduce the radiation dose to as low as reasonably achievable.  COMPARISON: None available. CLINICAL HISTORY: Pelvic pain s/p prostatectomy, lbp. FINDINGS: BONES AND ALIGNMENT: Lumbar lordosis is maintained. 2 mm retrolisthesis of L3 on L4. Vertebral body heights are maintained. No displaced fracture in the lumbar spine. Irregularity of the right L5 pars interarticularis likely related to chronic facet degenerative changes without definite pars defect. No suspicious bone lesion. DEGENERATIVE CHANGES: Degenerative endplate osteophytes at multiple levels. Schmorl nodes particularly at the L2-L3 level and L3-L4 level. Degenerative endplate changes and endplate cystic change noted at L3-L4. Disc space narrowing greatest at L3-L4. Small disc bulges at multiple levels. At L3-L4, disc bulge and posterior osteophytes result in lateral recess narrowing. Additional mild to moderate facet arthrosis and thickening of the ligamentum flavum contributing to mild spinal canal stenosis at L3-L4. There is mild bilateral foraminal stenosis at L3-L4. At L4-L5, additional disc bulge and posterior osteophyte resulting in mild lateral recess narrowing. There is mild foraminal stenosis on the right at L4-L5. At L5-S1, facet arthrosis is most pronounced. There is mild foraminal stenosis on the left at L5-S1. Facet arthrosis is present throughout the lumbar spine. SOFT TISSUES: Moderate  atherosclerosis of the abdominal aorta and branch vessels. Additional prominent atherosclerosis of the partially visualized splenic artery. No acute abnormality. IMPRESSION: 1. No acute fracture or traumatic malalignment of the lumbar spine. 2. Degenerative changes as above. 3. Disc bulge and posterior osteophytes at L3-L4 result in lateral recess narrowing, with additional mild to moderate facet arthrosis and thickening of the ligamentum flavum contributing to mild spinal canal stenosis. Electronically signed by: Donnice Mania MD 03/15/2024 01:13 PM EDT RP Workstation: [id number]   CT ABDOMEN PELVIS WO CONTRAST Result Date: 03/15/2024 EXAM: CT ABDOMEN AND PELVIS WITHOUT CONTRAST 03/15/2024 12:06:00 PM TECHNIQUE: CT of the abdomen and pelvis was performed without the administration of intravenous contrast. Multiplanar reformatted images are provided for review. Automated exposure control, iterative reconstruction, and/or weight-based adjustment of the mA/kV was utilized to reduce the radiation dose to as low as reasonably achievable. COMPARISON: 08/05/2023 at [facility]. CLINICAL HISTORY: Pelvic pain s/p prostatectomy, LBP. FINDINGS: LOWER CHEST: No acute abnormality. LIVER: The liver is unremarkable. GALLBLADDER AND BILE DUCTS: Status post cholecystectomy. No biliary ductal dilatation. SPLEEN: No acute abnormality. PANCREAS: No acute abnormality. ADRENAL GLANDS: Status post left adrenalectomy. KIDNEYS, URETERS AND BLADDER: Bilateral nonobstructive nephrolithiasis. No hydronephrosis. No perinephric or periureteral stranding. Urinary bladder is unremarkable. GI AND BOWEL: Stomach demonstrates no acute abnormality. Diverticulosis of descending and sigmoid colon without inflammation. There is no bowel obstruction. PERITONEUM AND RETROPERITONEUM: No ascites. No free air. VASCULATURE: Aorta is normal in caliber. Aortic atherosclerosis. LYMPH NODES: No lymphadenopathy. REPRODUCTIVE ORGANS: Status post  prostatectomy. BONES AND SOFT TISSUES: No acute osseous abnormality. No focal soft tissue abnormality. IMPRESSION: 1. Nonobstructive bilateral nephrolithiasis. 2. Diverticulosis of the descending and sigmoid colon without evidence of diverticulitis. 3. Postsurgical changes of cholecystectomy. 4. Postsurgical changes of left adrenalectomy. 5. Aortic atherosclerosis. Electronically signed by: Lynwood Seip MD 03/15/2024 12:46 PM EDT RP Workstation: [id number]    Pertinent labs & imaging results that were available during my care of the patient were reviewed by me and considered in my medical decision making (see MDM for details).  Medications Ordered in ED Medications  lidocaine  (LIDODERM ) 5 % 1 patch (1 patch Transdermal Patch Applied 03/15/24 1057)  ketorolac (TORADOL) 15 MG/ML injection 15 mg (15 mg Intravenous Given 03/15/24 1056)  morphine  (PF) 4 MG/ML injection 4 mg (4 mg Intravenous Given 03/15/24 1058)  ondansetron  (ZOFRAN ) injection 4 mg (4 mg Intravenous Given  03/15/24 1057)  sodium chloride  0.9 % bolus 1,000 mL (0 mLs Intravenous Stopped 03/15/24 1449)                                                                                                                                     Procedures Procedures  (including critical care time)  Medical Decision Making / ED Course    Medical Decision Making:    Tyleek Smick is a 78 y.o. male with past medical history as below, significant for DM, hypertension, BPH, HLD, UC who presents to the ED with complaint of back pain. The complaint involves an extensive differential diagnosis and also carries with it a high risk of complications and morbidity.  Serious etiology was considered. Ddx includes but is not limited to: Differential diagnosis includes but is not exclusive to musculoskeletal back pain, renal colic, urinary tract infection, pyelonephritis, intra-abdominal causes of back pain, aortic aneurysm or dissection, cauda equina syndrome,  sciatica, lumbar disc disease, thoracic disc disease, etc.   Complete initial physical exam performed, notably the patient was in no acute distress.    Reviewed and confirmed nursing documentation for past medical history, family history, social history.  Vital signs reviewed.    Low back pain following GU surgery> - Midline pain around L5, S1.  Nonradiating.  Neurologically intact.   - Analgesia> feeling much better - Labs: stable.  Lipase is elevated, he has no epigastric pain, no nausea or vomiting.  No evidence of pancreatitis on imaging.  Doubt acute pancreatitis  > He also has blood in his urine, leukocytes.  This is likely secondary to recent prostatectomy.  Doubt infection - CT lumbar, CT abdomen pelvis: Chronic changes noted, also disc bulging and posterior osteophytes noted, mild spinal stenosis - Back pain likely secondary to osteophyte/spinal stenosis.  Provided instructions regarding follow-up with spine surgery.  Supportive care at home in the interim. No heavy lifting.  Back pain precautions   Clinical Course as of 03/15/24 1608  Thu Mar 15, 2024  1403 Feeling much better [SG]    Clinical Course User Index [SG] Elnor Jayson LABOR, DO     Patient presents with low back pain without signs of spinal cord compression, cauda equina syndrome, infection, aneurysm, or other serious etiology. The patient is neurologically intact. Given the extremely low risk of these diagnoses further testing and evaluation for these possibilities does not appear to be indicated at this time. Detailed discussions were had with the patient and/or family and caregivers, regarding current findings, and need for close f/u with PCP or on call doctor. The patient has been instructed to return immediately if the symptoms worsen in any way. Patient verbalized understanding and is in agreement with current care plan. All questions answered prior to discharge.                Additional history  obtained: -Additional history obtained from family -External records from outside  source obtained and reviewed including: Chart review including previous notes, labs, imaging, consultation notes including  Recent surgical intervention, PDMP, home medications   Lab Tests: -I ordered, reviewed, and interpreted labs.   The pertinent results include:   Labs Reviewed  COMPREHENSIVE METABOLIC PANEL WITH GFR - Abnormal; Notable for the following components:      Result Value   Glucose, Bld 200 (*)    BUN 27 (*)    All other components within normal limits  LIPASE, BLOOD - Abnormal; Notable for the following components:   Lipase 79 (*)    All other components within normal limits  URINALYSIS, ROUTINE W REFLEX MICROSCOPIC - Abnormal; Notable for the following components:   APPearance HAZY (*)    Glucose, UA >=500 (*)    Hgb urine dipstick LARGE (*)    Protein, ur 100 (*)    Leukocytes,Ua SMALL (*)    All other components within normal limits  CBC WITH DIFFERENTIAL/PLATELET    Notable for as above  EKG   EKG Interpretation Date/Time:    Ventricular Rate:    PR Interval:    QRS Duration:    QT Interval:    QTC Calculation:   R Axis:      Text Interpretation:           Imaging Studies ordered: I ordered imaging studies including CT lumbar, CT abdomen I independently visualized the following imaging with scope of interpretation limited to determining acute life threatening conditions related to emergency care; findings noted above I agree with the radiologist interpretation If any imaging was obtained with contrast I closely monitored patient for any possible adverse reaction a/w contrast administration in the emergency department   Medicines ordered and prescription drug management: Meds ordered this encounter  Medications   lidocaine  (LIDODERM ) 5 % 1 patch   ketorolac (TORADOL) 15 MG/ML injection 15 mg   morphine  (PF) 4 MG/ML injection 4 mg   ondansetron  (ZOFRAN )  injection 4 mg   sodium chloride  0.9 % bolus 1,000 mL   HYDROcodone -acetaminophen  (NORCO/VICODIN) 5-325 MG tablet    Sig: Take 2 tablets by mouth every 4 (four) hours as needed.    Dispense:  10 tablet    Refill:  0   cyclobenzaprine (FLEXERIL) 5 MG tablet    Sig: Take 1 tablet (5 mg total) by mouth 2 (two) times daily as needed for muscle spasms.    Dispense:  10 tablet    Refill:  0   lidocaine  (LIDODERM ) 5 %    Sig: Place 1 patch onto the skin daily as needed. Remove & Discard patch within 12 hours or as directed by MD    Dispense:  15 patch    Refill:  0    -I have reviewed the patients home medicines and have made adjustments as needed   Consultations Obtained: Not applicable  Cardiac Monitoring: Continuous pulse oximetry interpreted by myself, 98% on RA.    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    Final Clinical Impression(s) / ED Diagnoses Final diagnoses:  Spinal stenosis, unspecified spinal region  Acute midline low back pain without sciatica        Elnor Jayson LABOR, DO 03/15/24 1608    Elnor Jayson LABOR, DO 03/15/24 1609

## 2024-03-15 NOTE — Discharge Instructions (Addendum)
 Please follow-up with your Primary Care Physician within the next week.  I have also provided referral to neurosurgery, be sure to call their office to arrange for outpatient follow-up appointment.  Please take your medications as instructed and discuss any changes to your medications with your primary care physician.    Please return to the Emergency Department if you have any leg numbness, leg weakness, difficulty walking, fevers, worsening of pain, lightheadedness, lose consciousness, severe abdominal pain, severe headache, difficulty urinating, or difficulty having a bowel movement.   Please return to the emergency department immediately for any new or concerning symptoms, or if you get worse.

## 2024-03-19 DIAGNOSIS — Z6826 Body mass index (BMI) 26.0-26.9, adult: Secondary | ICD-10-CM | POA: Diagnosis not present

## 2024-03-19 DIAGNOSIS — M47816 Spondylosis without myelopathy or radiculopathy, lumbar region: Secondary | ICD-10-CM | POA: Diagnosis not present

## 2024-03-21 DIAGNOSIS — K5090 Crohn's disease, unspecified, without complications: Secondary | ICD-10-CM | POA: Diagnosis not present

## 2024-03-21 DIAGNOSIS — Z23 Encounter for immunization: Secondary | ICD-10-CM | POA: Diagnosis not present

## 2024-03-21 DIAGNOSIS — Z6826 Body mass index (BMI) 26.0-26.9, adult: Secondary | ICD-10-CM | POA: Diagnosis not present

## 2024-03-23 DIAGNOSIS — R399 Unspecified symptoms and signs involving the genitourinary system: Secondary | ICD-10-CM | POA: Diagnosis not present

## 2024-03-23 DIAGNOSIS — R350 Frequency of micturition: Secondary | ICD-10-CM | POA: Diagnosis not present

## 2024-04-04 DIAGNOSIS — N3091 Cystitis, unspecified with hematuria: Secondary | ICD-10-CM | POA: Diagnosis not present

## 2024-04-04 DIAGNOSIS — R399 Unspecified symptoms and signs involving the genitourinary system: Secondary | ICD-10-CM | POA: Diagnosis not present

## 2024-04-04 DIAGNOSIS — R350 Frequency of micturition: Secondary | ICD-10-CM | POA: Diagnosis not present

## 2024-04-12 DIAGNOSIS — K432 Incisional hernia without obstruction or gangrene: Secondary | ICD-10-CM | POA: Diagnosis not present

## 2024-04-12 DIAGNOSIS — R350 Frequency of micturition: Secondary | ICD-10-CM | POA: Diagnosis not present

## 2024-04-12 DIAGNOSIS — R399 Unspecified symptoms and signs involving the genitourinary system: Secondary | ICD-10-CM | POA: Diagnosis not present

## 2024-04-12 DIAGNOSIS — R3913 Splitting of urinary stream: Secondary | ICD-10-CM | POA: Diagnosis not present

## 2024-04-20 DIAGNOSIS — K432 Incisional hernia without obstruction or gangrene: Secondary | ICD-10-CM | POA: Diagnosis not present

## 2024-04-23 DIAGNOSIS — N401 Enlarged prostate with lower urinary tract symptoms: Secondary | ICD-10-CM | POA: Diagnosis not present

## 2024-04-23 DIAGNOSIS — R3915 Urgency of urination: Secondary | ICD-10-CM | POA: Diagnosis not present

## 2024-05-02 NOTE — Progress Notes (Signed)
 Candon Caras                                          MRN: [id number]   05/02/2024   The [facility] Quality Team Specialist reviewed this patient medical record for the purposes of chart review for care gap closure. The following were reviewed: chart review for care gap closure-kidney health evaluation for diabetes:eGFR  and uACR.    [facility] Quality Team

## 2024-05-09 DIAGNOSIS — E1129 Type 2 diabetes mellitus with other diabetic kidney complication: Secondary | ICD-10-CM | POA: Diagnosis not present

## 2024-05-09 DIAGNOSIS — E782 Mixed hyperlipidemia: Secondary | ICD-10-CM | POA: Diagnosis not present

## 2024-05-09 DIAGNOSIS — Z6826 Body mass index (BMI) 26.0-26.9, adult: Secondary | ICD-10-CM | POA: Diagnosis not present

## 2024-05-09 DIAGNOSIS — T7840XA Allergy, unspecified, initial encounter: Secondary | ICD-10-CM | POA: Diagnosis not present

## 2024-05-10 DIAGNOSIS — I10 Essential (primary) hypertension: Secondary | ICD-10-CM | POA: Diagnosis not present

## 2024-05-10 DIAGNOSIS — I872 Venous insufficiency (chronic) (peripheral): Secondary | ICD-10-CM | POA: Diagnosis not present

## 2024-05-10 DIAGNOSIS — R0989 Other specified symptoms and signs involving the circulatory and respiratory systems: Secondary | ICD-10-CM | POA: Diagnosis not present

## 2024-05-10 DIAGNOSIS — I1A0 Resistant hypertension: Secondary | ICD-10-CM | POA: Diagnosis not present

## 2024-05-19 ENCOUNTER — Emergency Department (HOSPITAL_COMMUNITY)

## 2024-05-19 ENCOUNTER — Other Ambulatory Visit: Payer: Self-pay

## 2024-05-19 ENCOUNTER — Encounter (HOSPITAL_COMMUNITY): Payer: Self-pay | Admitting: *Deleted

## 2024-05-19 ENCOUNTER — Emergency Department (HOSPITAL_COMMUNITY)
Admission: EM | Admit: 2024-05-19 | Discharge: 2024-05-20 | Disposition: A | Attending: Emergency Medicine | Admitting: Emergency Medicine

## 2024-05-19 DIAGNOSIS — Z79899 Other long term (current) drug therapy: Secondary | ICD-10-CM | POA: Insufficient documentation

## 2024-05-19 DIAGNOSIS — I10 Essential (primary) hypertension: Secondary | ICD-10-CM | POA: Insufficient documentation

## 2024-05-19 DIAGNOSIS — R079 Chest pain, unspecified: Secondary | ICD-10-CM | POA: Diagnosis present

## 2024-05-19 DIAGNOSIS — R519 Headache, unspecified: Secondary | ICD-10-CM | POA: Diagnosis not present

## 2024-05-19 LAB — BASIC METABOLIC PANEL WITH GFR
Anion gap: 11 (ref 5–15)
BUN: 25 mg/dL — ABNORMAL HIGH (ref 8–23)
CO2: 23 mmol/L (ref 22–32)
Calcium: 9.5 mg/dL (ref 8.9–10.3)
Chloride: 104 mmol/L (ref 98–111)
Creatinine, Ser: 0.94 mg/dL (ref 0.61–1.24)
GFR, Estimated: >60 mL/min
Glucose, Bld: 127 mg/dL — ABNORMAL HIGH (ref 70–99)
Potassium: 4 mmol/L (ref 3.5–5.1)
Sodium: 137 mmol/L (ref 135–145)

## 2024-05-19 LAB — CBC
HCT: 47.3 % (ref 39.0–52.0)
Hemoglobin: 16.3 g/dL (ref 13.0–17.0)
MCH: 30.6 pg (ref 26.0–34.0)
MCHC: 34.5 g/dL (ref 30.0–36.0)
MCV: 88.9 fL (ref 80.0–100.0)
Platelets: 203 K/uL (ref 150–400)
RBC: 5.32 MIL/uL (ref 4.22–5.81)
RDW: 13.5 % (ref 11.5–15.5)
WBC: 5.7 K/uL (ref 4.0–10.5)
nRBC: 0 % (ref 0.0–0.2)

## 2024-05-19 LAB — TROPONIN T, HIGH SENSITIVITY: Troponin T High Sensitivity: 17 ng/L (ref 0–19)

## 2024-05-19 NOTE — ED Triage Notes (Signed)
 The pt has had these symptoms for 2 weeks but today was worse

## 2024-05-19 NOTE — ED Triage Notes (Addendum)
 Patient reports CP and SHOB starting last night, his BP was elevated as well. Patient states his cardiologist told him to take and extra hydralazine . Patient appears nervous at registration.

## 2024-05-19 NOTE — ED Provider Notes (Incomplete)
 " [place] EMERGENCY DEPARTMENT AT [facility] Provider Note   CSN: [id number] Arrival date & time: 05/19/24  1851     Patient presents with: Chest Pain and Shortness of Breath   Michael Houston is a 78 y.o. male.  Patient with past medical history significant for Cushing syndrome, chest discomfort, hypertension, UC presents to the emergency department complaining of some chest pain with mild shortness of breath that occurred this afternoon and last night.  He also states that he was concerned because his blood pressure hit 200 systolic.  He endorsed a mild headache/fogginess at the time.  Currently he patient is asymptomatic.  He is concerned that there could be underlying issues causing his blood pressure to be high and is concerned about his headache in conjunction with his high blood pressure  {Add pertinent medical, surgical, social history, OB history to HPI:32947}  Chest Pain Associated symptoms: shortness of breath   Shortness of Breath Associated symptoms: chest pain        Prior to Admission medications  Medication Sig Start Date End Date Taking? Authorizing Provider  amLODipine  (NORVASC ) 5 MG tablet Take 5 mg by mouth daily. 07/13/19   [provider]  carvedilol  (COREG ) 12.5 MG tablet Take 12.5 mg by mouth 2 (two) times daily with a meal. 04/14/11   [provider]  cyclobenzaprine  (FLEXERIL ) 5 MG tablet Take 1 tablet (5 mg total) by mouth 2 (two) times daily as needed for muscle spasms. 03/15/24   Elnor Savant A, DO  diclofenac Sodium (VOLTAREN ARTHRITIS PAIN) 1 % GEL Apply 2 g topically daily as needed (Shoulder pain).    [provider]  docusate sodium  (COLACE) 100 MG capsule Take 1 capsule (100 mg total) by mouth 2 (two) times daily. 02/24/24   Cory Palma, PA-C  febuxostat (ULORIC) 40 MG tablet Take 40 mg by mouth daily.    [provider]  finasteride  (PROSCAR ) 5 MG tablet Take 5 mg by mouth daily.    [provider]  fluticasone (FLONASE) 50 MCG/ACT nasal spray Place 2 sprays into both nostrils daily as needed for rhinitis. 04/15/21   [provider]  glipiZIDE (GLUCOTROL XL) 10 MG 24 hr tablet Take 10 mg by mouth daily.    [provider]  hydrALAZINE  (APRESOLINE ) 25 MG tablet Take 25 mg by mouth 3 (three) times daily.    [provider]  HYDROcodone -acetaminophen  (NORCO/VICODIN) 5-325 MG tablet Take 2 tablets by mouth every 4 (four) hours as needed. 03/15/24   Elnor Savant LABOR, DO  JARDIANCE 25 MG TABS tablet Take 25 mg by mouth daily.    [provider]  lidocaine  (LIDODERM ) 5 % Place 1 patch onto the skin daily as needed. Remove & Discard patch within 12 hours or as directed by MD 03/15/24   Elnor Savant LABOR, DO  mesalamine (APRISO) 0.375 g 24 hr capsule Take 375 mg by mouth daily as needed (crohn's disease). 08/01/19   [provider]  nitroGLYCERIN (NITROSTAT) 0.4 MG SL tablet Place 0.4 mg under the tongue every 5 (five) minutes as needed for chest pain. 07/08/17   [provider]  pantoprazole  (PROTONIX ) 40 MG tablet Take 40 mg by mouth 2 (two) times a week. 07/08/17   [provider]  spironolactone  (ALDACTONE ) 25 MG tablet Take 25 mg by mouth daily.    [provider]  tadalafil (CIALIS) 20 MG tablet Take 20 mg by mouth daily as needed for erectile dysfunction.  [provider]  telmisartan (MICARDIS) 80 MG tablet Take 80 mg by mouth daily. 07/26/19   [provider]  TOUJEO SOLOSTAR 300 UNIT/ML Solostar Pen Inject 15 Units into the skin daily. 10/31/23   [provider]    Allergies: Iodine-131, Penicillins, and Semaglutide    Review of Systems  Respiratory:  Positive for shortness of breath.   Cardiovascular:  Positive for chest pain.    Updated Vital Signs BP (!) 161/90   Pulse (!) 44   Temp (!) 97.5 F (36.4 C) (Oral)   Resp 15   Ht 5' 7 (1.702 m)   Wt 79.4 kg   SpO2 99%   BMI 27.42 kg/m    Physical Exam Vitals and nursing note reviewed.  Constitutional:      General: He is not in acute distress.    Appearance: He is well-developed.  HENT:     Head: Normocephalic and atraumatic.  Eyes:     Conjunctiva/sclera: Conjunctivae normal.  Cardiovascular:     Rate and Rhythm: Normal rate and regular rhythm.     Heart sounds: No murmur heard. Pulmonary:     Effort: Pulmonary effort is normal. No respiratory distress.     Breath sounds: Normal breath sounds.  Abdominal:     Palpations: Abdomen is soft.     Tenderness: There is no abdominal tenderness.  Musculoskeletal:        General: No swelling.     Cervical back: Neck supple.     Right lower leg: No edema.     Left lower leg: No edema.  Skin:    General: Skin is warm and dry.     Capillary Refill: Capillary refill takes less than 2 seconds.  Neurological:     General: No focal deficit present.     Mental Status: He is alert.  Psychiatric:        Mood and Affect: Mood normal.     (all labs ordered are listed, but only abnormal results are displayed) Labs Reviewed  BASIC METABOLIC PANEL WITH GFR - Abnormal; Notable for the following components:      Result Value   Glucose, Bld 127 (*)    BUN 25 (*)    All other components within normal limits  CBC  TROPONIN T, HIGH SENSITIVITY    EKG: None  Radiology: CT Head Wo Contrast Result Date: 05/19/2024 EXAM: CT HEAD WITHOUT CONTRAST 05/19/2024 10:52:00 PM TECHNIQUE: CT of the head was performed without the administration of intravenous contrast. Automated exposure control, iterative reconstruction, and/or weight based adjustment of the mA/kV was utilized to reduce the radiation dose to as low as reasonably achievable. COMPARISON: Comparison with 04/16/2021. CLINICAL HISTORY: Headache, new onset (Age >= 51y). FINDINGS: BRAIN AND VENTRICLES: No acute hemorrhage. No evidence of acute infarct. No hydrocephalus. No extra-axial collection. No mass effect or midline shift.  Generalized atrophy and chronic small vessel ischemic change. ORBITS: No acute abnormality. SINUSES: No acute abnormality. SOFT TISSUES AND SKULL: No acute soft tissue abnormality. No skull fracture. IMPRESSION: 1. No acute intracranial abnormality. Electronically signed by: Norman Gatlin MD 05/19/2024 10:58 PM EST RP Workstation: [id number]   DG Chest 2 View Result Date: 05/19/2024 CLINICAL DATA:  Chest pain EXAM: CHEST - 2 VIEW COMPARISON:  05/14/2024 FINDINGS: No acute airspace disease or pleural effusion. Stable cardiomediastinal silhouette with aortic atherosclerosis. Old right mid clavicle fracture. Multiple old right-sided rib fractures. IMPRESSION: No active cardiopulmonary disease. Electronically Signed   By: Luke Scott HERO.D.  On: 05/19/2024 19:38    {Document cardiac monitor, telemetry assessment procedure when appropriate:32947} Procedures   Medications Ordered in the ED - No data to display    {Click here for ABCD2, HEART and other calculators REFRESH Note before signing:1}                              Medical Decision Making Amount and/or Complexity of Data Reviewed Radiology: ordered.   This patient presents to the ED for concern of chest pain, this involves an extensive number of treatment options, and is a complaint that carries with it a high risk of complications and morbidity.  The differential diagnosis includes anxiety, ACS, hypertensive urgency, pneumonia, PE, others.  He also complained of a mild headache.  Differential includes tension headache, cluster headache, migraine, intracranial abnormality, others   Co morbidities / Chronic conditions that complicate the patient evaluation  As noted in HPI   Additional history obtained:  Additional history obtained from EMR External records from outside source obtained and reviewed including vascular surgery note   Lab Tests:  I Ordered, and personally interpreted labs.  The pertinent results include: Troponin  17, creatinine 0.94   Imaging Studies ordered:  I ordered imaging studies including chest x-ray, CT head without contrast I independently visualized and interpreted imaging which showed no acute findings I agree with the radiologist interpretation   Cardiac Monitoring: / EKG:  The patient was maintained on a cardiac monitor.  I personally viewed and interpreted the cardiac monitored which showed an underlying rhythm of: Sinus bradycardia  Test / Admission - Considered:  Patient presented with hypertension and concerns about chest pain and a mild headache.  Chest pain subsided prior to my assessment of the patient.  He had no symptoms at the time of my assessment.  A head CT was performed with no acute findings.  No sign of intracranial normality.  Troponin was 17 with a nonischemic EKG.  No signs of ACS.  Patient's blood pressures improved to the 150 systolic range.  At this time patient appears stable for discharge home with outpatient follow-up for further management of his hypertension.  Patient is in agreement with plan.  Return precautions provided.   {Document critical care time when appropriate  Document review of labs and clinical decision tools ie CHADS2VASC2, etc  Document your independent review of radiology images and any outside records  Document your discussion with family members, caretakers and with consultants  Document social determinants of health affecting pt's care  Document your decision making why or why not admission, treatments were needed:32947:::1}   Final diagnoses:  None    ED Discharge Orders     None        "

## 2024-05-19 NOTE — ED Provider Triage Note (Signed)
 Emergency Medicine Provider Triage Evaluation Note  Michael Houston , a 78 y.o. male  was evaluated in triage.  Pt complains of cp. Report for the past 2 weeks he has intermittent chest pressure, overall not feeling well and BP has been high despite monitoring his diet and medication.  No fever, chills, cough, sob, leg swelling, n/v.  Review of Systems  Positive: As above Negative: As above  Physical Exam  BP (!) 183/81 (BP Location: Right Arm)   Pulse 60   Temp (!) 97.5 F (36.4 C) (Oral)   Resp 20   Ht 5' 7 (1.702 m)   Wt 79.4 kg   SpO2 92%   BMI 27.42 kg/m  Gen:   Awake, no distress   Resp:  Normal effort  MSK:   Moves extremities without difficulty  Other:    Medical Decision Making  Medically screening exam initiated at 7:13 PM.  Appropriate orders placed.  Michael Houston was informed that the remainder of the evaluation will be completed by another provider, this initial triage assessment does not replace that evaluation, and the importance of remaining in the ED until their evaluation is complete.     Nivia Colon, PA-C 05/19/24 1914

## 2024-05-19 NOTE — ED Provider Notes (Signed)
 " [place] EMERGENCY DEPARTMENT AT [facility] Provider Note   CSN: [id number] Arrival date & time: 05/19/24  1851     Patient presents with: Chest Pain and Shortness of Breath   Michael Houston is a 78 y.o. male.  Patient with past medical history significant for Cushing syndrome, chest discomfort, hypertension, UC presents to the emergency department complaining of some chest pain with mild shortness of breath that occurred this afternoon and last night.  He also states that he was concerned because his blood pressure hit 200 systolic.  He endorsed a mild headache/fogginess at the time.  Currently he patient is asymptomatic.  He is concerned that there could be underlying issues causing his blood pressure to be high and is concerned about his headache in conjunction with his high blood pressure    Chest Pain Associated symptoms: shortness of breath   Shortness of Breath Associated symptoms: chest pain        Prior to Admission medications  Medication Sig Start Date End Date Taking? Authorizing Provider  amLODipine  (NORVASC ) 5 MG tablet Take 5 mg by mouth daily. 07/13/19   [provider]  carvedilol  (COREG ) 12.5 MG tablet Take 12.5 mg by mouth 2 (two) times daily with a meal. 04/14/11   [provider]  cyclobenzaprine  (FLEXERIL ) 5 MG tablet Take 1 tablet (5 mg total) by mouth 2 (two) times daily as needed for muscle spasms. 03/15/24   Elnor Savant A, DO  diclofenac Sodium (VOLTAREN ARTHRITIS PAIN) 1 % GEL Apply 2 g topically daily as needed (Shoulder pain).    [provider]  docusate sodium  (COLACE) 100 MG capsule Take 1 capsule (100 mg total) by mouth 2 (two) times daily. 02/24/24   Cory Palma, PA-C  febuxostat (ULORIC) 40 MG tablet Take 40 mg by mouth daily.    [provider]  finasteride  (PROSCAR ) 5 MG tablet Take 5 mg by mouth daily.    [provider]  fluticasone (FLONASE) 50 MCG/ACT nasal spray Place 2 sprays into both  nostrils daily as needed for rhinitis. 04/15/21   [provider]  glipiZIDE (GLUCOTROL XL) 10 MG 24 hr tablet Take 10 mg by mouth daily.    [provider]  hydrALAZINE  (APRESOLINE ) 25 MG tablet Take 25 mg by mouth 3 (three) times daily.    [provider]  HYDROcodone -acetaminophen  (NORCO/VICODIN) 5-325 MG tablet Take 2 tablets by mouth every 4 (four) hours as needed. 03/15/24   Elnor Savant LABOR, DO  JARDIANCE 25 MG TABS tablet Take 25 mg by mouth daily.    [provider]  lidocaine  (LIDODERM ) 5 % Place 1 patch onto the skin daily as needed. Remove & Discard patch within 12 hours or as directed by MD 03/15/24   Elnor Savant LABOR, DO  mesalamine (APRISO) 0.375 g 24 hr capsule Take 375 mg by mouth daily as needed (crohn's disease). 08/01/19   [provider]  nitroGLYCERIN (NITROSTAT) 0.4 MG SL tablet Place 0.4 mg under the tongue every 5 (five) minutes as needed for chest pain. 07/08/17   [provider]  pantoprazole  (PROTONIX ) 40 MG tablet Take 40 mg by mouth 2 (two) times a week. 07/08/17   [provider]  spironolactone  (ALDACTONE ) 25 MG tablet Take 25 mg by mouth daily.    [provider]  tadalafil (CIALIS) 20 MG tablet Take 20 mg by mouth daily as needed for erectile dysfunction.    [provider]  telmisartan (MICARDIS) 80 MG tablet  Take 80 mg by mouth daily. 07/26/19   [provider]  TOUJEO SOLOSTAR 300 UNIT/ML Solostar Pen Inject 15 Units into the skin daily. 10/31/23   [provider]    Allergies: Iodine-131, Penicillins, and Semaglutide    Review of Systems  Respiratory:  Positive for shortness of breath.   Cardiovascular:  Positive for chest pain.    Updated Vital Signs BP (!) 161/90   Pulse (!) 44   Temp 97.7 F (36.5 C) (Oral)   Resp 15   Ht 5' 7 (1.702 m)   Wt 79.4 kg   SpO2 99%   BMI 27.42 kg/m   Physical Exam Vitals and nursing note reviewed.  Constitutional:       General: He is not in acute distress.    Appearance: He is well-developed.  HENT:     Head: Normocephalic and atraumatic.  Eyes:     Conjunctiva/sclera: Conjunctivae normal.  Cardiovascular:     Rate and Rhythm: Normal rate and regular rhythm.     Heart sounds: No murmur heard. Pulmonary:     Effort: Pulmonary effort is normal. No respiratory distress.     Breath sounds: Normal breath sounds.  Abdominal:     Palpations: Abdomen is soft.     Tenderness: There is no abdominal tenderness.  Musculoskeletal:        General: No swelling.     Cervical back: Neck supple.     Right lower leg: No edema.     Left lower leg: No edema.  Skin:    General: Skin is warm and dry.     Capillary Refill: Capillary refill takes less than 2 seconds.  Neurological:     General: No focal deficit present.     Mental Status: He is alert.  Psychiatric:        Mood and Affect: Mood normal.     (all labs ordered are listed, but only abnormal results are displayed) Labs Reviewed  BASIC METABOLIC PANEL WITH GFR - Abnormal; Notable for the following components:      Result Value   Glucose, Bld 127 (*)    BUN 25 (*)    All other components within normal limits  CBC  TROPONIN T, HIGH SENSITIVITY    EKG: None  Radiology: CT Head Wo Contrast Result Date: 05/19/2024 EXAM: CT HEAD WITHOUT CONTRAST 05/19/2024 10:52:00 PM TECHNIQUE: CT of the head was performed without the administration of intravenous contrast. Automated exposure control, iterative reconstruction, and/or weight based adjustment of the mA/kV was utilized to reduce the radiation dose to as low as reasonably achievable. COMPARISON: Comparison with 04/16/2021. CLINICAL HISTORY: Headache, new onset (Age >= 51y). FINDINGS: BRAIN AND VENTRICLES: No acute hemorrhage. No evidence of acute infarct. No hydrocephalus. No extra-axial collection. No mass effect or midline shift. Generalized atrophy and chronic small vessel ischemic change. ORBITS: No  acute abnormality. SINUSES: No acute abnormality. SOFT TISSUES AND SKULL: No acute soft tissue abnormality. No skull fracture. IMPRESSION: 1. No acute intracranial abnormality. Electronically signed by: Norman Gatlin MD 05/19/2024 10:58 PM EST RP Workstation: [id number]   DG Chest 2 View Result Date: 05/19/2024 CLINICAL DATA:  Chest pain EXAM: CHEST - 2 VIEW COMPARISON:  05/14/2024 FINDINGS: No acute airspace disease or pleural effusion. Stable cardiomediastinal silhouette with aortic atherosclerosis. Old right mid clavicle fracture. Multiple old right-sided rib fractures. IMPRESSION: No active cardiopulmonary disease. Electronically Signed   By: Luke Bun M.D.   On: 05/19/2024 19:38     Procedures  Medications Ordered in the ED - No data to display                                  Medical Decision Making Amount and/or Complexity of Data Reviewed Radiology: ordered.   This patient presents to the ED for concern of chest pain, this involves an extensive number of treatment options, and is a complaint that carries with it a high risk of complications and morbidity.  The differential diagnosis includes anxiety, ACS, hypertensive urgency, pneumonia, PE, others.  He also complained of a mild headache.  Differential includes tension headache, cluster headache, migraine, intracranial abnormality, others   Co morbidities / Chronic conditions that complicate the patient evaluation  As noted in HPI   Additional history obtained:  Additional history obtained from EMR External records from outside source obtained and reviewed including vascular surgery note   Lab Tests:  I Ordered, and personally interpreted labs.  The pertinent results include: Troponin 17, creatinine 0.94   Imaging Studies ordered:  I ordered imaging studies including chest x-ray, CT head without contrast I independently visualized and interpreted imaging which showed no acute findings I agree with the  radiologist interpretation   Cardiac Monitoring: / EKG:  The patient was maintained on a cardiac monitor.  I personally viewed and interpreted the cardiac monitored which showed an underlying rhythm of: Sinus bradycardia  Test / Admission - Considered:  Patient presented with hypertension and concerns about chest pain and a mild headache.  Chest pain subsided prior to my assessment of the patient.  He had no symptoms at the time of my assessment.  A head CT was performed with no acute findings.  No sign of intracranial normality.  Troponin was 17 with a nonischemic EKG.  No signs of ACS.  Patient's blood pressures improved to the 150 systolic range.  At this time patient appears stable for discharge home with outpatient follow-up for further management of his hypertension.  Patient is in agreement with plan.  Return precautions provided.      Final diagnoses:  Chest pain, unspecified type  Hypertension, unspecified type    ED Discharge Orders     None          Logan Ubaldo KATHEE DEVONNA 05/20/24 0015    Trine Raynell Moder, MD 05/20/24 [phone]  "

## 2024-05-20 NOTE — Discharge Instructions (Signed)
 Your workup this evening was reassuring.  I do recommend following up with your primary care provider for further management of your high blood pressure.  If you develop any life-threatening symptoms please return immediately to the emergency department.
